# Patient Record
Sex: Female | Born: 1970 | Race: Black or African American | Hispanic: No | Marital: Single | Smoking: Never smoker
Health system: Southern US, Community
[De-identification: ages and names within clinical notes are randomized; demographics above are authoritative.]

## PROBLEM LIST (undated history)

## (undated) DIAGNOSIS — I10 Essential (primary) hypertension: Secondary | ICD-10-CM

## (undated) DIAGNOSIS — T7840XA Allergy, unspecified, initial encounter: Secondary | ICD-10-CM

---

## 2000-10-19 ENCOUNTER — Emergency Department (HOSPITAL_COMMUNITY): Admission: EM | Admit: 2000-10-19 | Discharge: 2000-10-19 | Payer: Self-pay | Admitting: Emergency Medicine

## 2001-01-04 ENCOUNTER — Emergency Department (HOSPITAL_COMMUNITY): Admission: EM | Admit: 2001-01-04 | Discharge: 2001-01-04 | Payer: Self-pay | Admitting: Emergency Medicine

## 2001-10-27 ENCOUNTER — Other Ambulatory Visit: Admission: RE | Admit: 2001-10-27 | Discharge: 2001-10-27 | Payer: Self-pay | Admitting: Obstetrics and Gynecology

## 2002-04-09 ENCOUNTER — Emergency Department (HOSPITAL_COMMUNITY): Admission: EM | Admit: 2002-04-09 | Discharge: 2002-04-09 | Payer: Self-pay | Admitting: Emergency Medicine

## 2004-03-08 ENCOUNTER — Emergency Department (HOSPITAL_COMMUNITY): Admission: EM | Admit: 2004-03-08 | Discharge: 2004-03-08 | Payer: Self-pay | Admitting: Emergency Medicine

## 2008-02-09 ENCOUNTER — Emergency Department (HOSPITAL_COMMUNITY): Admission: EM | Admit: 2008-02-09 | Discharge: 2008-02-10 | Payer: Self-pay | Admitting: Emergency Medicine

## 2008-06-18 ENCOUNTER — Emergency Department (HOSPITAL_COMMUNITY): Admission: EM | Admit: 2008-06-18 | Discharge: 2008-06-18 | Payer: Self-pay | Admitting: Emergency Medicine

## 2009-04-23 ENCOUNTER — Emergency Department (HOSPITAL_COMMUNITY): Admission: EM | Admit: 2009-04-23 | Discharge: 2009-04-23 | Payer: Self-pay | Admitting: Emergency Medicine

## 2010-03-01 ENCOUNTER — Emergency Department (HOSPITAL_COMMUNITY): Admission: EM | Admit: 2010-03-01 | Discharge: 2010-03-01 | Payer: Self-pay | Admitting: Emergency Medicine

## 2010-03-04 ENCOUNTER — Emergency Department (HOSPITAL_COMMUNITY): Admission: EM | Admit: 2010-03-04 | Discharge: 2010-03-05 | Payer: Self-pay | Admitting: Emergency Medicine

## 2010-03-31 ENCOUNTER — Emergency Department (HOSPITAL_COMMUNITY)
Admission: EM | Admit: 2010-03-31 | Discharge: 2010-04-01 | Payer: Self-pay | Source: Home / Self Care | Admitting: Emergency Medicine

## 2010-04-08 ENCOUNTER — Observation Stay (HOSPITAL_COMMUNITY)
Admission: EM | Admit: 2010-04-08 | Discharge: 2010-04-08 | Payer: Self-pay | Source: Home / Self Care | Admitting: Emergency Medicine

## 2010-04-08 ENCOUNTER — Encounter (INDEPENDENT_AMBULATORY_CARE_PROVIDER_SITE_OTHER): Payer: Self-pay | Admitting: Emergency Medicine

## 2010-05-12 ENCOUNTER — Emergency Department (HOSPITAL_COMMUNITY)
Admission: EM | Admit: 2010-05-12 | Discharge: 2010-05-12 | Payer: Self-pay | Source: Home / Self Care | Admitting: Emergency Medicine

## 2010-05-13 LAB — RAPID STREP SCREEN (MED CTR MEBANE ONLY): Streptococcus, Group A Screen (Direct): NEGATIVE

## 2010-05-28 ENCOUNTER — Encounter
Admission: RE | Admit: 2010-05-28 | Discharge: 2010-05-28 | Payer: Self-pay | Source: Home / Self Care | Attending: Allergy and Immunology | Admitting: Allergy and Immunology

## 2010-06-21 ENCOUNTER — Emergency Department (HOSPITAL_COMMUNITY): Payer: PRIVATE HEALTH INSURANCE

## 2010-06-21 ENCOUNTER — Emergency Department (HOSPITAL_COMMUNITY)
Admission: EM | Admit: 2010-06-21 | Discharge: 2010-06-22 | Disposition: A | Discharge status: Home / Self Care | Payer: PRIVATE HEALTH INSURANCE | Attending: Emergency Medicine | Admitting: Emergency Medicine

## 2010-06-21 DIAGNOSIS — R109 Unspecified abdominal pain: Secondary | ICD-10-CM | POA: Insufficient documentation

## 2010-06-21 DIAGNOSIS — Z79899 Other long term (current) drug therapy: Secondary | ICD-10-CM | POA: Insufficient documentation

## 2010-06-21 DIAGNOSIS — R52 Pain, unspecified: Secondary | ICD-10-CM

## 2010-06-21 DIAGNOSIS — I10 Essential (primary) hypertension: Secondary | ICD-10-CM | POA: Insufficient documentation

## 2010-06-21 LAB — DIFFERENTIAL
Basophils Absolute: 0 10*3/uL (ref 0.0–0.1)
Basophils Relative: 0 % (ref 0–1)
Eosinophils Absolute: 0.1 10*3/uL (ref 0.0–0.7)
Eosinophils Relative: 1 % (ref 0–5)
Lymphocytes Relative: 27 % (ref 12–46)
Lymphs Abs: 2.8 10*3/uL (ref 0.7–4.0)
Monocytes Absolute: 0.7 10*3/uL (ref 0.1–1.0)
Monocytes Relative: 7 % (ref 3–12)
Neutro Abs: 6.6 10*3/uL (ref 1.7–7.7)
Neutrophils Relative %: 65 % (ref 43–77)

## 2010-06-21 LAB — COMPREHENSIVE METABOLIC PANEL
ALT: 12 U/L (ref 0–35)
AST: 16 U/L (ref 0–37)
Albumin: 3.6 g/dL (ref 3.5–5.2)
Alkaline Phosphatase: 60 U/L (ref 39–117)
BUN: 9 mg/dL (ref 6–23)
CO2: 29 mEq/L (ref 19–32)
Calcium: 9.5 mg/dL (ref 8.4–10.5)
Chloride: 104 mEq/L (ref 96–112)
Creatinine, Ser: 0.94 mg/dL (ref 0.4–1.2)
GFR calc Af Amer: >60 mL/min (ref >60)
GFR calc non Af Amer: >60 mL/min (ref >60)
Glucose, Bld: 101 mg/dL — ABNORMAL HIGH (ref 70–99)
Potassium: 3.1 mEq/L — ABNORMAL LOW (ref 3.5–5.1)
Sodium: 139 mEq/L (ref 135–145)
Total Bilirubin: 0.5 mg/dL (ref 0.3–1.2)
Total Protein: 7.3 g/dL (ref 6.0–8.3)

## 2010-06-21 LAB — LIPASE, BLOOD: Lipase: 18 U/L (ref 11–59)

## 2010-06-21 LAB — CBC
HCT: 36.1 % (ref 36.0–46.0)
Hemoglobin: 11.3 g/dL — ABNORMAL LOW (ref 12.0–15.0)
MCH: 28 pg (ref 26.0–34.0)
MCHC: 31.3 g/dL (ref 30.0–36.0)
MCV: 89.4 fL (ref 78.0–100.0)
Platelets: 275 10*3/uL (ref 150–400)
RBC: 4.04 MIL/uL (ref 3.87–5.11)
RDW: 13 % (ref 11.5–15.5)
WBC: 10.1 10*3/uL (ref 4.0–10.5)

## 2010-06-22 LAB — URINALYSIS, ROUTINE W REFLEX MICROSCOPIC
Bilirubin Urine: NEGATIVE
Hgb urine dipstick: NEGATIVE
Ketones, ur: NEGATIVE mg/dL
Nitrite: NEGATIVE
Protein, ur: NEGATIVE mg/dL
Specific Gravity, Urine: 1.029 (ref 1.005–1.030)
Urine Glucose, Fasting: NEGATIVE mg/dL
Urobilinogen, UA: 1 mg/dL (ref 0.0–1.0)
pH: 6 (ref 5.0–8.0)

## 2010-06-22 LAB — POCT PREGNANCY, URINE: Preg Test, Ur: NEGATIVE

## 2010-06-25 ENCOUNTER — Inpatient Hospital Stay (INDEPENDENT_AMBULATORY_CARE_PROVIDER_SITE_OTHER)
Admission: RE | Admit: 2010-06-25 | Discharge: 2010-06-25 | Disposition: A | Discharge status: Home / Self Care | Payer: PRIVATE HEALTH INSURANCE | Source: Ambulatory Visit | Attending: Emergency Medicine | Admitting: Emergency Medicine

## 2010-06-25 DIAGNOSIS — E876 Hypokalemia: Secondary | ICD-10-CM

## 2010-06-25 LAB — POCT I-STAT, CHEM 8
BUN: 7 mg/dL (ref 6–23)
Calcium, Ion: 1.15 mmol/L (ref 1.12–1.32)
Chloride: 100 mEq/L (ref 96–112)
Creatinine, Ser: 1.1 mg/dL (ref 0.4–1.2)
Glucose, Bld: 93 mg/dL (ref 70–99)
HCT: 40 % (ref 36.0–46.0)
Hemoglobin: 13.6 g/dL (ref 12.0–15.0)
Potassium: 3.4 mEq/L — ABNORMAL LOW (ref 3.5–5.1)
Sodium: 140 mEq/L (ref 135–145)
TCO2: 29 mmol/L (ref 0–100)

## 2010-07-09 LAB — BASIC METABOLIC PANEL
BUN: 6 mg/dL (ref 6–23)
CO2: 28 mEq/L (ref 19–32)
Calcium: 9.5 mg/dL (ref 8.4–10.5)
Chloride: 101 mEq/L (ref 96–112)
Creatinine, Ser: 0.86 mg/dL (ref 0.4–1.2)
GFR calc Af Amer: >60 mL/min (ref >60)
GFR calc non Af Amer: >60 mL/min (ref >60)
Glucose, Bld: 115 mg/dL — ABNORMAL HIGH (ref 70–99)
Potassium: 2.8 mEq/L — ABNORMAL LOW (ref 3.5–5.1)
Sodium: 138 mEq/L (ref 135–145)

## 2010-07-09 LAB — DIFFERENTIAL
Basophils Absolute: 0 10*3/uL (ref 0.0–0.1)
Basophils Relative: 0 % (ref 0–1)
Eosinophils Absolute: 0.1 10*3/uL (ref 0.0–0.7)
Eosinophils Relative: 1 % (ref 0–5)
Lymphocytes Relative: 26 % (ref 12–46)
Lymphs Abs: 2.5 10*3/uL (ref 0.7–4.0)
Monocytes Absolute: 0.8 10*3/uL (ref 0.1–1.0)
Monocytes Relative: 8 % (ref 3–12)
Neutro Abs: 6.3 10*3/uL (ref 1.7–7.7)
Neutrophils Relative %: 65 % (ref 43–77)

## 2010-07-09 LAB — URINALYSIS, ROUTINE W REFLEX MICROSCOPIC
Glucose, UA: NEGATIVE mg/dL
Hgb urine dipstick: NEGATIVE
Ketones, ur: NEGATIVE mg/dL
Nitrite: NEGATIVE
Protein, ur: NEGATIVE mg/dL
Specific Gravity, Urine: 1.024 (ref 1.005–1.030)
Urobilinogen, UA: 1 mg/dL (ref 0.0–1.0)
pH: 6 (ref 5.0–8.0)

## 2010-07-09 LAB — POCT CARDIAC MARKERS
CKMB, poc: <1 ng/mL — ABNORMAL LOW (ref 1.0–8.0)
CKMB, poc: <1 ng/mL — ABNORMAL LOW (ref 1.0–8.0)
Myoglobin, poc: 48.3 ng/mL (ref 12–200)
Myoglobin, poc: 62.9 ng/mL (ref 12–200)
Troponin i, poc: 0.11 ng/mL — ABNORMAL HIGH (ref 0.00–0.09)
Troponin i, poc: <0.05 ng/mL (ref 0.00–0.09)

## 2010-07-09 LAB — CBC
HCT: 33.8 % — ABNORMAL LOW (ref 36.0–46.0)
Hemoglobin: 10.9 g/dL — ABNORMAL LOW (ref 12.0–15.0)
MCH: 28.9 pg (ref 26.0–34.0)
MCHC: 32.2 g/dL (ref 30.0–36.0)
MCV: 89.7 fL (ref 78.0–100.0)
Platelets: 289 10*3/uL (ref 150–400)
RBC: 3.77 MIL/uL — ABNORMAL LOW (ref 3.87–5.11)
RDW: 14.2 % (ref 11.5–15.5)
WBC: 9.7 10*3/uL (ref 4.0–10.5)

## 2010-07-09 LAB — TROPONIN I
Troponin I: 0.02 ng/mL (ref 0.00–0.06)
Troponin I: 0.02 ng/mL (ref 0.00–0.06)

## 2010-07-09 LAB — CK TOTAL AND CKMB (NOT AT ARMC)
CK, MB: 0.5 ng/mL (ref 0.3–4.0)
CK, MB: 0.6 ng/mL (ref 0.3–4.0)
Relative Index: INVALID (ref 0.0–2.5)
Relative Index: INVALID (ref 0.0–2.5)
Total CK: 77 U/L (ref 7–177)
Total CK: 80 U/L (ref 7–177)

## 2011-07-08 ENCOUNTER — Encounter (HOSPITAL_COMMUNITY): Payer: Self-pay | Admitting: *Deleted

## 2011-07-08 ENCOUNTER — Emergency Department (INDEPENDENT_AMBULATORY_CARE_PROVIDER_SITE_OTHER)
Admission: EM | Admit: 2011-07-08 | Discharge: 2011-07-08 | Disposition: A | Discharge status: Home Health | Payer: Self-pay | Source: Home / Self Care | Attending: Family Medicine | Admitting: Family Medicine

## 2011-07-08 DIAGNOSIS — I10 Essential (primary) hypertension: Secondary | ICD-10-CM

## 2011-07-08 HISTORY — DX: Essential (primary) hypertension: I10

## 2011-07-08 HISTORY — DX: Allergy, unspecified, initial encounter: T78.40XA

## 2011-07-08 LAB — POCT I-STAT, CHEM 8
BUN: 7 mg/dL (ref 6–23)
Chloride: 102 mEq/L (ref 96–112)
Creatinine, Ser: 1 mg/dL (ref 0.50–1.10)
Sodium: 140 mEq/L (ref 135–145)
TCO2: 27 mmol/L (ref 0–100)

## 2011-07-08 MED ORDER — HYDROCHLOROTHIAZIDE 25 MG PO TABS: 25.0000 mg | ORAL_TABLET | Freq: Every day | ORAL | Status: AC

## 2011-07-08 NOTE — ED Notes (Signed)
Pt reports she was Dx'd with HTN 2011 and her RX for HCTZ 25 mg ran out 2 days ago.   The Dr at the [facility] would not see her without payment

## 2011-07-08 NOTE — ED Provider Notes (Signed)
History     CSN: [id number]  Arrival date & time 07/08/11  1044   First MD Initiated Contact with Patient 07/08/11 1057      Chief Complaint  Patient presents with  . Hypertension    (Consider location/radiation/quality/duration/timing/severity/associated sxs/prior treatment) Patient is a 41 y.o. female presenting with hypertension. The history is provided by the patient.  Hypertension This is a new problem. The current episode started 2 days ago (ran out of bp med 2 d ago.). The problem has not changed since onset.Pertinent negatives include no chest pain, no headaches and no shortness of breath. Associated symptoms comments: Has been on hctz for approx 2 yrs..    Past Medical History  Diagnosis Date  . Hypertension   . Allergic     History reviewed. No pertinent past surgical history.  Family History  Problem Relation Age of Onset  . Diabetes Mother   . Hyperlipidemia Mother     History  Substance Use Topics  . Smoking status: Never Smoker   . Smokeless tobacco: Not on file  . Alcohol Use: Yes    OB History    Grav Para Term Preterm Abortions TAB SAB Ect Mult Living                  Review of Systems  Constitutional: Negative.   Respiratory: Negative for chest tightness and shortness of breath.   Cardiovascular: Negative for chest pain.  Neurological: Negative for headaches.    Allergies  Review of patient's allergies indicates no known allergies.  Home Medications   Current Outpatient Rx  Name Route Sig Dispense Refill  . FLUTICASONE FUROATE 27.5 MCG/SPRAY NA SUSP Nasal Place 2 sprays into the nose daily.    Marland Kitchen HYDROCHLOROTHIAZIDE 25 MG PO TABS Oral Take 25 mg by mouth daily.      BP 112/55  Pulse 86  Temp(Src) 97.9 F (36.6 C) (Oral)  Resp 16  SpO2 99%  LMP 06/13/2011  Physical Exam  Nursing note and vitals reviewed. Constitutional: She is oriented to person, place, and time. She appears well-developed and well-nourished.  HENT:  Head:  Normocephalic.  Eyes: Pupils are equal, round, and reactive to light.  Neck: Normal range of motion. Neck supple.  Cardiovascular: Normal rate, regular rhythm, normal heart sounds and intact distal pulses.   Pulmonary/Chest: Effort normal and breath sounds normal.  Lymphadenopathy:    She has no cervical adenopathy.  Neurological: She is alert and oriented to person, place, and time.    ED Course  Procedures (including critical care time)  Labs Reviewed - No data to display No results found.   No diagnosis found.    MDM          Linna Hoff, MD 07/08/11 [phone]

## 2012-10-20 IMAGING — CR DG NECK SOFT TISSUE
1 series · 1 of 1 positions shown · non-contrast
Comparison: None.

CLINICAL DATA: Sore throat

NECK SOFT TISSUES - 1+ VIEW

[w soft tissue neck *]
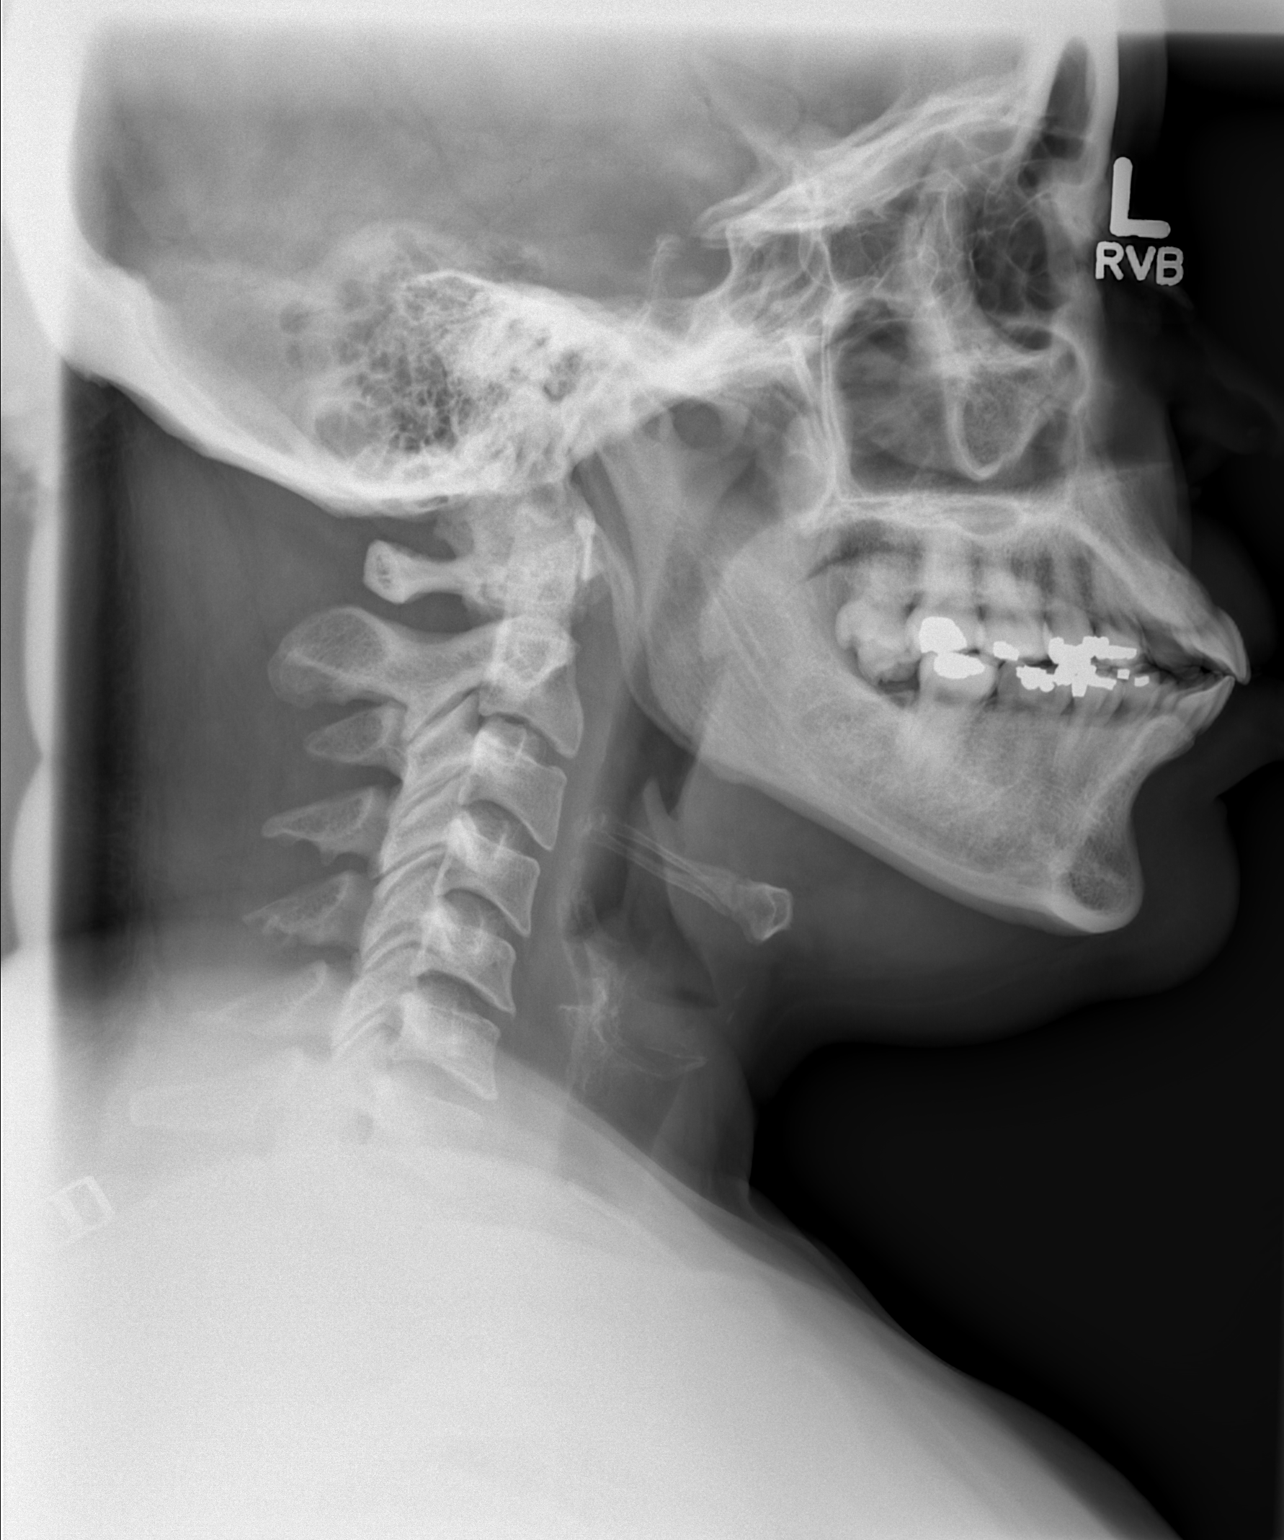

[1 of 1 positions shown; findings below may reference images not displayed]

FINDINGS: The epiglottis and aryepiglottic folds do not appear
enlarged.  No dilation of the hypopharynx.  No prominence of the
retropharyngeal space.
IMPRESSION: Negative single lateral view of the neck.

## 2013-01-01 ENCOUNTER — Emergency Department (HOSPITAL_COMMUNITY)
Admission: EM | Admit: 2013-01-01 | Discharge: 2013-01-01 | Disposition: A | Discharge status: Home / Self Care | Payer: PRIVATE HEALTH INSURANCE | Attending: Emergency Medicine | Admitting: Emergency Medicine

## 2013-01-01 ENCOUNTER — Encounter (HOSPITAL_COMMUNITY): Payer: Self-pay | Admitting: Emergency Medicine

## 2013-01-01 ENCOUNTER — Emergency Department (HOSPITAL_COMMUNITY): Payer: PRIVATE HEALTH INSURANCE

## 2013-01-01 DIAGNOSIS — I10 Essential (primary) hypertension: Secondary | ICD-10-CM | POA: Insufficient documentation

## 2013-01-01 DIAGNOSIS — M25569 Pain in unspecified knee: Secondary | ICD-10-CM | POA: Insufficient documentation

## 2013-01-01 DIAGNOSIS — M25561 Pain in right knee: Secondary | ICD-10-CM

## 2013-01-01 DIAGNOSIS — Z79899 Other long term (current) drug therapy: Secondary | ICD-10-CM | POA: Insufficient documentation

## 2013-01-01 MED ORDER — NAPROXEN 500 MG PO TABS: 500.0000 mg | ORAL_TABLET | Freq: Two times a day (BID) | ORAL | Status: DC

## 2013-01-01 NOTE — ED Provider Notes (Signed)
Medical screening examination/treatment/procedure(s) were performed by non-physician practitioner and as supervising physician I was immediately available for consultation/collaboration.     Celene Kras, MD 01/01/13 [phone]

## 2013-01-01 NOTE — ED Provider Notes (Signed)
CSN: [id number]     Arrival date & time 01/01/13  4098 History   First MD Initiated Contact with Patient 01/01/13 1924     Chief Complaint  Patient presents with  . Knee Pain   (Consider location/radiation/quality/duration/timing/severity/associated sxs/prior Treatment) HPI Patient is a 42 year old female who presents to emergency department complaining of right knee pain. Patient states that the pain began 2 weeks ago. Patient denies any injuries to the leg. Patient states that it is painful for her to bear weight and bend the knee. Patient does report some clicking and popping sensation. Patient has not tried any medications or icing at home. Patient reports that she has had prior right knee problems in the past, states she has seen her doctor a few years ago for the same pain and was told she had fluid on her knee. Patient has never seen an orthopedic doctor for this problem. Patient denies any known injuries. Patient denies any numbness or weakness in the leg. Patient denies any fever chills or Past Medical History  Diagnosis Date  . Hypertension   . Allergic    No past surgical history on file. Family History  Problem Relation Age of Onset  . Diabetes Mother   . Hyperlipidemia Mother    History  Substance Use Topics  . Smoking status: Never Smoker   . Smokeless tobacco: Not on file  . Alcohol Use: Yes   OB History   Grav Para Term Preterm Abortions TAB SAB Ect Mult Living                 Review of Systems  Constitutional: Negative for fever and chills.  HENT: Negative for neck pain and neck stiffness.   Respiratory: Negative for cough, chest tightness and shortness of breath.   Cardiovascular: Negative for chest pain, palpitations and leg swelling.  Musculoskeletal: Positive for joint swelling and arthralgias.  Skin: Negative for color change and rash.  Neurological: Negative for dizziness, weakness, numbness and headaches.  All other systems reviewed and are  negative.    Allergies  Review of patient's allergies indicates no known allergies.  Home Medications   Current Outpatient Rx  Name  Route  Sig  Dispense  Refill  . fexofenadine (ALLEGRA) 180 MG tablet   Oral   Take 180 mg by mouth daily.         Marland Kitchen triamcinolone (NASACORT) 55 MCG/ACT nasal inhaler   Nasal   Place 2 sprays into the nose daily.          BP 138/79  Pulse 81  Temp(Src) 98.1 F (36.7 C) (Oral)  Resp 16  SpO2 94%  LMP 01/01/2013 Physical Exam  Nursing note and vitals reviewed. Constitutional: She appears well-developed and well-nourished. No distress.  Eyes: Conjunctivae are normal.  Neck: Neck supple.  Musculoskeletal:  Normal appearing right knee. Tender to palpation over medial joint. Full ROM of the knee joint. Pain with full flexion and extension. Negative anterior and posterior drawer signs. No laxity or pain with medial or lateral stress.    Neurological: She is alert.  Skin: Skin is warm and dry.    ED Course  Procedures (including critical care time) Labs Review Labs Reviewed - No data to display Imaging Review Dg Knee Complete 4 Views Right  01/01/2013   *RADIOLOGY REPORT*  Clinical Data: Generalized right knee pain  RIGHT KNEE - COMPLETE 4+ VIEW  Comparison: None.  Findings: No fracture dislocation of the right knee.  There is a moderate sized  suprapatellar joint effusion.  Mild spurring of the superior aspect patella.  IMPRESSION: Moderate joint effusion.  No acute osseous abnormality.   Original Report Authenticated By: Genevive Bi, M.D.    MDM   1. Knee pain, right      Patient with acute on chronic right knee pain. Exam is unremarkable. X-ray shows moderate joint effusion. Patient is afebrile. Joint is not red, not warm to the touch, doubt infectious. Did not have any recent injuries. I suspect possible arthritis versus meniscal injury. Given Ace wrap for swelling and support. Home with Naprosyn and followup with  orthopedics.  Filed Vitals:   01/01/13 1859  BP: 138/79  Pulse: 81  Temp: 98.1 F (36.7 C)  TempSrc: Oral  Resp: 16  SpO2: 94%        Marques Ericson A Harvest Deist, PA-C 01/01/13 2016

## 2013-01-01 NOTE — ED Notes (Signed)
Pt c/o rt knee pain x 2 wks.  Denies falling.  States she noticed it while at work.

## 2013-03-26 ENCOUNTER — Emergency Department (HOSPITAL_COMMUNITY): Payer: Self-pay

## 2013-03-26 ENCOUNTER — Encounter (HOSPITAL_COMMUNITY): Payer: Self-pay | Admitting: Emergency Medicine

## 2013-03-26 ENCOUNTER — Emergency Department (HOSPITAL_COMMUNITY)
Admission: EM | Admit: 2013-03-26 | Discharge: 2013-03-26 | Disposition: A | Discharge status: Home / Self Care | Payer: Self-pay | Attending: Emergency Medicine | Admitting: Emergency Medicine

## 2013-03-26 DIAGNOSIS — IMO0002 Reserved for concepts with insufficient information to code with codable children: Secondary | ICD-10-CM | POA: Insufficient documentation

## 2013-03-26 DIAGNOSIS — J209 Acute bronchitis, unspecified: Secondary | ICD-10-CM | POA: Insufficient documentation

## 2013-03-26 DIAGNOSIS — I10 Essential (primary) hypertension: Secondary | ICD-10-CM | POA: Insufficient documentation

## 2013-03-26 DIAGNOSIS — Z79899 Other long term (current) drug therapy: Secondary | ICD-10-CM | POA: Insufficient documentation

## 2013-03-26 DIAGNOSIS — J40 Bronchitis, not specified as acute or chronic: Secondary | ICD-10-CM

## 2013-03-26 MED ORDER — ALBUTEROL SULFATE HFA 108 (90 BASE) MCG/ACT IN AERS
1.0000 | INHALATION_SPRAY | Freq: Four times a day (QID) | RESPIRATORY_TRACT | Status: DC | PRN
  Filled 2013-03-26: qty 6.7

## 2013-03-26 MED ORDER — AZITHROMYCIN 250 MG PO TABS: 250.0000 mg | ORAL_TABLET | Freq: Every day | ORAL | Status: DC

## 2013-03-26 NOTE — ED Notes (Signed)
Pt c/o productive cough w/ yellow mucous x 1 wk.  Denies NVD.

## 2013-03-26 NOTE — Discharge Instructions (Signed)
Bronchitis °Bronchitis is a problem of the air tubes leading to your lungs. This problem makes it hard for air to get in and out of the lungs. You may cough a lot because your air tubes are narrow. Going without care can cause lasting (chronic) bronchitis. °HOME CARE  °· Drink enough fluids to keep your pee (urine) clear or pale yellow. °· Use a cool mist humidifier. °· Quit smoking if you smoke. If you keep smoking, the bronchitis might not get better. °· Only take medicine as told by your doctor. °GET HELP RIGHT AWAY IF:  °· Coughing keeps you awake. °· You start to wheeze. °· You become more sick or weak. °· You have a hard time breathing or get short of breath. °· You cough up blood. °· Coughing lasts more than 2 weeks. °· You have a fever. °· Your baby is older than 3 months with a rectal temperature of 102° F (38.9° C) or higher. °· Your baby is 3 months old or younger with a rectal temperature of 100.4° F (38° C) or higher. °MAKE SURE YOU: °· Understand these instructions. °· Will watch your condition. °· Will get help right away if you are not doing well or get worse. °Document Released: 10/01/2007 Document Revised: 07/07/2011 Document Reviewed: 12/07/2012 °ExitCare® Patient Information ©2014 [employer]. ° °

## 2013-03-26 NOTE — ED Provider Notes (Addendum)
CSN: [id number]     Arrival date & time 03/26/13  1339 History   First MD Initiated Contact with Patient 03/26/13 1346     Chief Complaint  Patient presents with  . Cough   (Consider location/radiation/quality/duration/timing/severity/associated sxs/prior Treatment) Patient is a 42 y.o. female presenting with cough. The history is provided by the patient.  Cough Cough characteristics:  Productive Sputum characteristics:  Yellow Severity:  Severe Onset quality:  Gradual Duration:  10 days Timing:  Constant Progression:  Worsening Chronicity:  New Smoker: no   Context: upper respiratory infection   Relieved by:  Nothing Worsened by:  Lying down and activity Ineffective treatments:  Leukotriene antagonist and decongestant Associated symptoms: rhinorrhea and wheezing   Associated symptoms: no chest pain, no fever, no myalgias and no shortness of breath   Risk factors comment:  Works in [employer] school   Past Medical History  Diagnosis Date  . Hypertension   . Allergic    No past surgical history on file. Family History  Problem Relation Age of Onset  . Diabetes Mother   . Hyperlipidemia Mother    History  Substance Use Topics  . Smoking status: Never Smoker   . Smokeless tobacco: Not on file  . Alcohol Use: Yes   OB History   Grav Para Term Preterm Abortions TAB SAB Ect Mult Living                 Review of Systems  Constitutional: Negative for fever.  HENT: Positive for rhinorrhea.   Respiratory: Positive for cough and wheezing. Negative for shortness of breath.   Cardiovascular: Negative for chest pain.  Musculoskeletal: Negative for myalgias.  All other systems reviewed and are negative.    Allergies  Review of patient's allergies indicates no known allergies.  Home Medications   Current Outpatient Rx  Name  Route  Sig  Dispense  Refill  . fexofenadine (ALLEGRA) 180 MG tablet   Oral   Take 180 mg by mouth daily.         Marland Kitchen triamcinolone  (NASACORT) 55 MCG/ACT nasal inhaler   Nasal   Place 2 sprays into the nose daily.          LMP 03/12/2013 Physical Exam  Nursing note and vitals reviewed. Constitutional: She is oriented to person, place, and time. She appears well-developed and well-nourished. No distress.  HENT:  Head: Normocephalic and atraumatic.  Right Ear: Tympanic membrane and ear canal normal.  Left Ear: Tympanic membrane and ear canal normal.  Mouth/Throat: No oropharyngeal exudate or posterior oropharyngeal edema.  Eyes: EOM are normal. Pupils are equal, round, and reactive to light.  Cardiovascular: Normal rate, regular rhythm, normal heart sounds and intact distal pulses.  Exam reveals no friction rub.   No murmur heard. Pulmonary/Chest: Effort normal and breath sounds normal. She has no wheezes. She has no rales.  Abdominal: Soft. Bowel sounds are normal. She exhibits no distension. There is no tenderness. There is no rebound and no guarding.  Musculoskeletal: Normal range of motion. She exhibits no tenderness.  No edema  Neurological: She is alert and oriented to person, place, and time. No cranial nerve deficit.  Skin: Skin is warm and dry. No rash noted.  Psychiatric: She has a normal mood and affect. Her behavior is normal.    ED Course  Procedures (including critical care time) Labs Review Labs Reviewed - No data to display Imaging Review Dg Chest 2 View  03/26/2013   CLINICAL DATA:  Productive cough  EXAM: CHEST  2 VIEW  COMPARISON:  04/08/2010  FINDINGS: Lungs are clear. No pleural effusion or pneumothorax.  The heart is normal in size.  Degenerative changes of the visualized thoracolumbar spine.  IMPRESSION: No evidence of acute cardiopulmonary disease.   Electronically Signed   By: Charline Bills M.D.   On: 03/26/2013 14:30    EKG Interpretation   None       MDM   1. Bronchitis     Pt with symptoms consistent with bronchitis which has now been ongoing for 10 days.  Well  appearing here.  No signs of breathing difficulty  No signs of pharyngitis, otitis or abnormal abdominal findings.   CXR wnl and pt to return with any further problems. Pt treated with inhaler and zithromycin     Gwyneth Sprout, MD 03/26/13 1458  Gwyneth Sprout, MD 03/26/13 1459

## 2013-03-31 ENCOUNTER — Emergency Department (HOSPITAL_COMMUNITY)
Admission: EM | Admit: 2013-03-31 | Discharge: 2013-03-31 | Disposition: A | Discharge status: Home / Self Care | Payer: Self-pay | Attending: Emergency Medicine | Admitting: Emergency Medicine

## 2013-03-31 ENCOUNTER — Emergency Department (HOSPITAL_COMMUNITY): Payer: Self-pay

## 2013-03-31 ENCOUNTER — Encounter (HOSPITAL_COMMUNITY): Payer: Self-pay | Admitting: Emergency Medicine

## 2013-03-31 DIAGNOSIS — I10 Essential (primary) hypertension: Secondary | ICD-10-CM | POA: Insufficient documentation

## 2013-03-31 DIAGNOSIS — R05 Cough: Secondary | ICD-10-CM

## 2013-03-31 DIAGNOSIS — Z79899 Other long term (current) drug therapy: Secondary | ICD-10-CM | POA: Insufficient documentation

## 2013-03-31 DIAGNOSIS — R059 Cough, unspecified: Secondary | ICD-10-CM | POA: Insufficient documentation

## 2013-03-31 MED ORDER — LEVOFLOXACIN 750 MG PO TABS: 750.0000 mg | ORAL_TABLET | Freq: Every day | ORAL | Status: DC

## 2013-03-31 MED ORDER — HYDROCOD POLST-CHLORPHEN POLST 10-8 MG/5ML PO LQCR: 5.0000 mL | Freq: Two times a day (BID) | ORAL | Status: DC | PRN

## 2013-03-31 NOTE — ED Notes (Signed)
Pt alert, nad, arrives from home, c/o cough, onset several days ago, resp even unlabored, skin pwd

## 2013-03-31 NOTE — Progress Notes (Signed)
[facility] provided pt with a list of primary care resources and a [facility] application. Also, provided pt with information on [employer].

## 2013-03-31 NOTE — ED Provider Notes (Signed)
Medical screening examination/treatment/procedure(s) were performed by non-physician practitioner and as supervising physician I was immediately available for consultation/collaboration.  EKG Interpretation   None         Shanna Cisco, MD 03/31/13 [phone]

## 2013-03-31 NOTE — ED Provider Notes (Signed)
CSN: [id number]     Arrival date & time 03/31/13  1027 History   First MD Initiated Contact with Patient 03/31/13 1044     Chief Complaint  Patient presents with  . Cough   (Consider location/radiation/quality/duration/timing/severity/associated sxs/prior Treatment) Patient is a 42 y.o. female presenting with cough. The history is provided by the patient and medical records.  Cough  This is a 42 y.o. F with PMH significant for HTN, seasonal allergies, presenting to the ED for continued productive cough x 2 weeks.  Pt seen in the ED on 03/26/13 for the same-- given inhaler and z-pack.  States she took the last dose of her abx yesterday.  Upon waking this morning, states she feels worse.  Pt has been using inhaler as directed but has not noticed a great deal of improvement.  Denies any chest pain, SOB, or palpitations.  No sick contacts.  No fevers, sweats, or chills. VS stable on arrival.  Past Medical History  Diagnosis Date  . Hypertension   . Allergic    History reviewed. No pertinent past surgical history. Family History  Problem Relation Age of Onset  . Diabetes Mother   . Hyperlipidemia Mother    History  Substance Use Topics  . Smoking status: Never Smoker   . Smokeless tobacco: Not on file  . Alcohol Use: Yes   OB History   Grav Para Term Preterm Abortions TAB SAB Ect Mult Living                 Review of Systems  Respiratory: Positive for cough.   All other systems reviewed and are negative.    Allergies  Review of patient's allergies indicates no known allergies.  Home Medications   Current Outpatient Rx  Name  Route  Sig  Dispense  Refill  . albuterol (PROVENTIL HFA;VENTOLIN HFA) 108 (90 BASE) MCG/ACT inhaler   Inhalation   Inhale 2 puffs into the lungs every 6 (six) hours as needed for wheezing or shortness of breath.         Marland Kitchen azithromycin (ZITHROMAX) 250 MG tablet   Oral   Take 1 tablet (250 mg total) by mouth daily. Take first 2 tablets together,  then 1 every day until finished.   6 tablet   0   . fexofenadine (ALLEGRA) 180 MG tablet   Oral   Take 180 mg by mouth daily.         Marland Kitchen triamcinolone (NASACORT) 55 MCG/ACT nasal inhaler   Nasal   Place 2 sprays into the nose daily.          BP 152/83  Pulse 87  Temp(Src) 98 F (36.7 C) (Oral)  Resp 16  SpO2 94%  LMP 03/12/2013  Physical Exam  Nursing note and vitals reviewed. Constitutional: She is oriented to person, place, and time. She appears well-developed and well-nourished. No distress.  HENT:  Head: Normocephalic and atraumatic.  Right Ear: Tympanic membrane and ear canal normal.  Left Ear: Tympanic membrane and ear canal normal.  Nose: Nose normal.  Mouth/Throat: Uvula is midline, oropharynx is clear and moist and mucous membranes are normal.  Some PND noted in oropharynx; tonsils are normal in appearance bilaterally without exudate, uvula midline, no peritonsillar abscess  Eyes: Conjunctivae and EOM are normal. Pupils are equal, round, and reactive to light.  Neck: Normal range of motion. Neck supple.  Cardiovascular: Normal rate, regular rhythm and normal heart sounds.   Pulmonary/Chest: Effort normal and breath sounds normal. No respiratory  distress. She has no wheezes.  Lungs CTAB  Musculoskeletal: Normal range of motion.  Neurological: She is alert and oriented to person, place, and time.  Skin: Skin is warm and dry. She is not diaphoretic.  Psychiatric: She has a normal mood and affect.    ED Course  Procedures (including critical care time) Labs Review Labs Reviewed - No data to display Imaging Review Dg Chest 2 View  03/31/2013   CLINICAL DATA:  Shortness of breath, mild coughing  EXAM: CHEST  2 VIEW  COMPARISON:  03/26/2013  FINDINGS: The heart size and vascular pattern are normal. No effusion. Left lung is clear. There is minimal opacity in the medial right lung base. This is not seen on the lateral view.  IMPRESSION: Minimal opacity medial right  lung base. This could represent mild atelectasis. In the appropriate clinical setting, the possibility of subtle developing pneumonia would be a consideration as well.   Electronically Signed   By: Esperanza Heir M.D.   On: 03/31/2013 11:08    EKG Interpretation   None       MDM   1. Cough    Pt overall well appearing, lungs CTAB.  O2 sats stable on RA.  CXR as above-- questionable early developing pneumonia.  Given pts continued sx, will start on course of Levaquin and tussionex.  FU with [facility] if sx persist/no improvement after this course of abx.  Discussed plan with pt, she agreed.  Return precautions advised.  Garlon Hatchet, PA-C 03/31/13 1319

## 2015-06-12 IMAGING — CR DG KNEE COMPLETE 4+V*R*
5 series · 5 of 5 positions shown · non-contrast
Comparison: None.

CLINICAL DATA: Generalized right knee pain

RIGHT KNEE - COMPLETE 4+ VIEW

[t knee ap right]
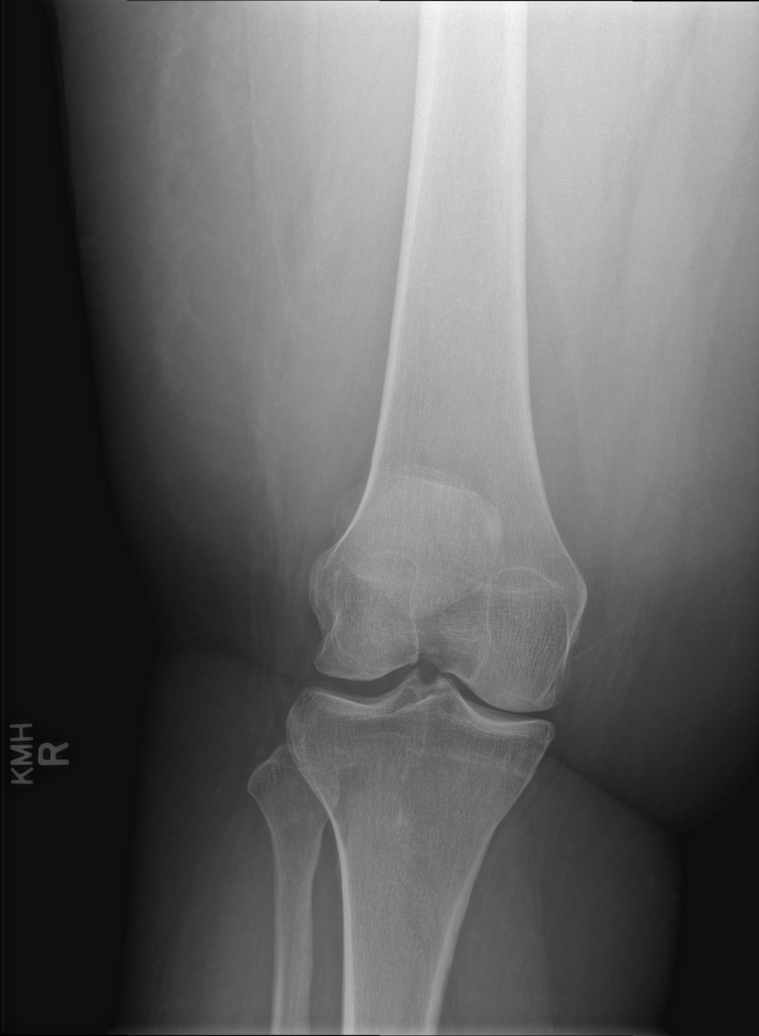

[t knee obl right (1 of 2)]
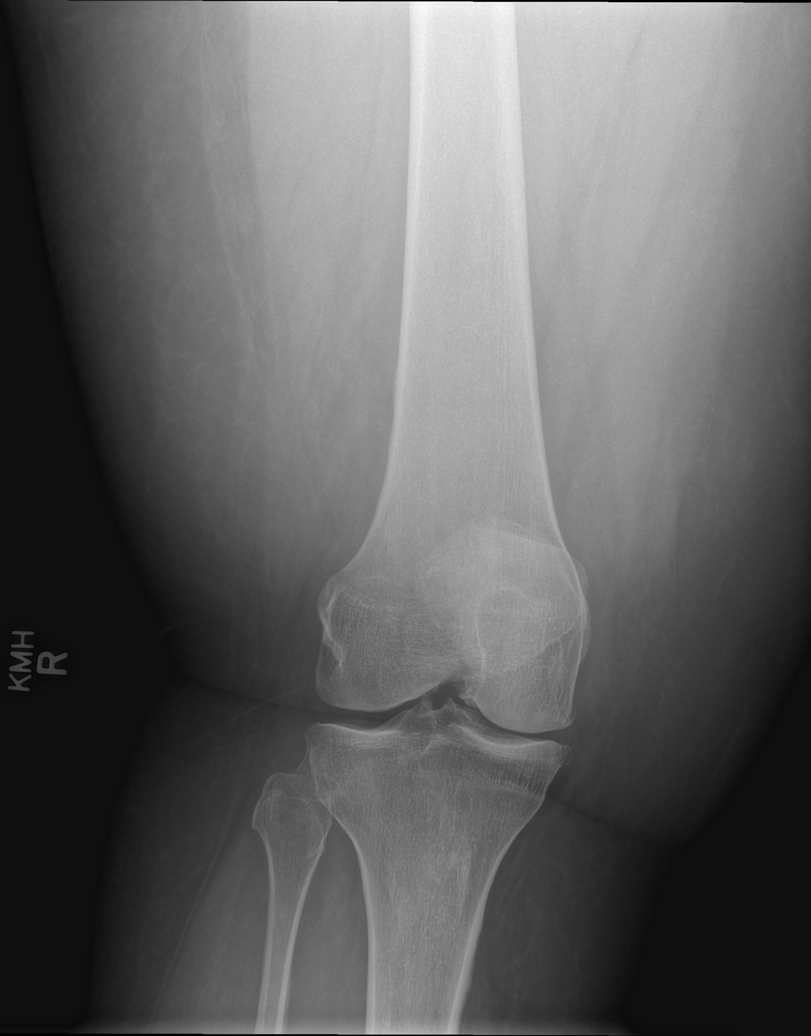

[t knee obl right (2 of 2)]
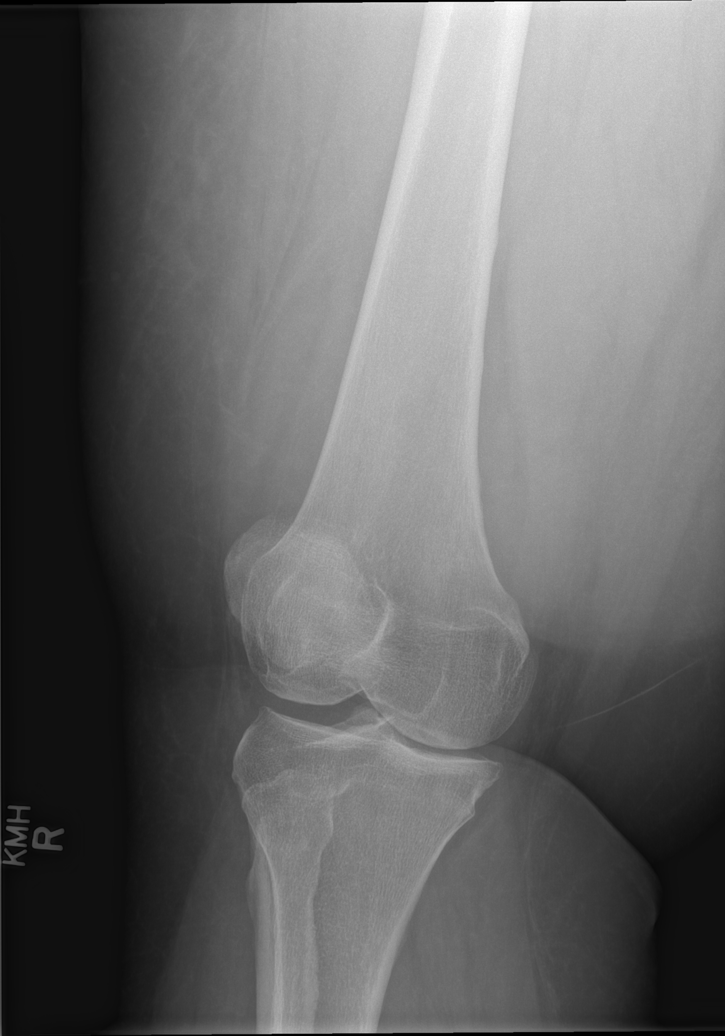

[t knee lat right (1 of 2)]
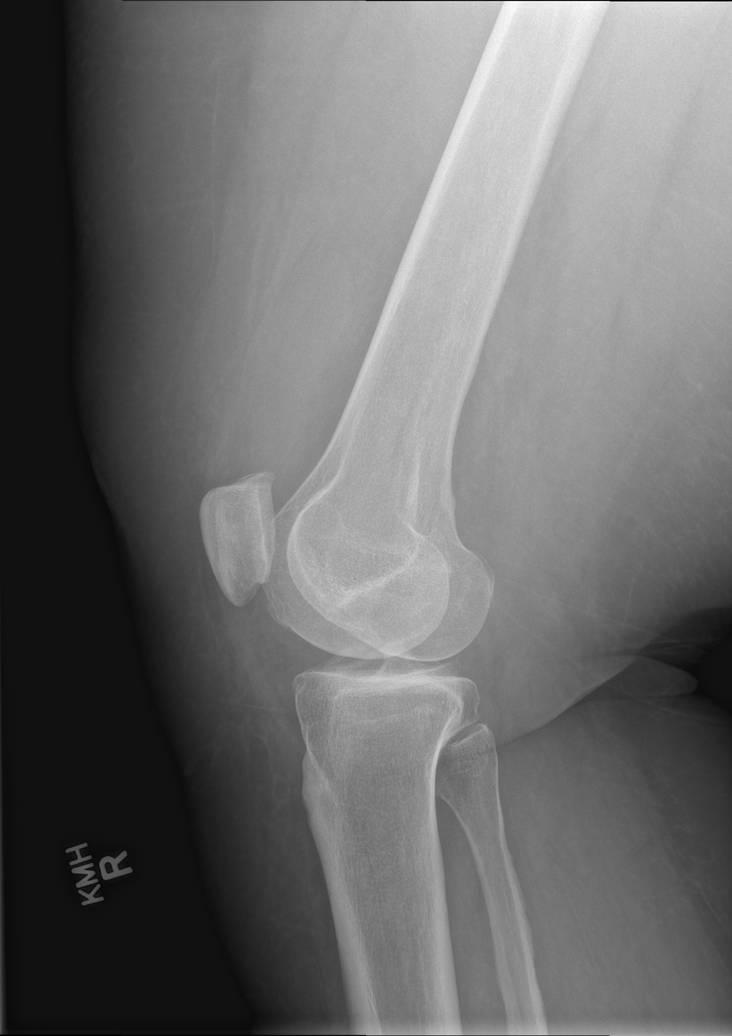

[t knee lat right (2 of 2)]
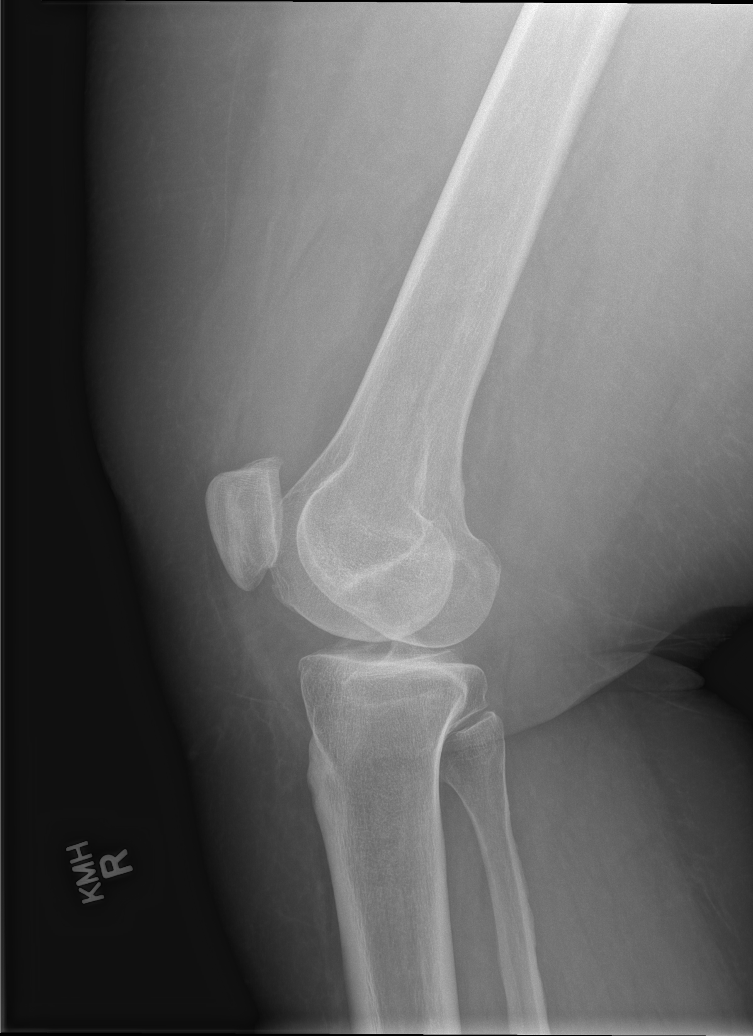

[5 of 5 positions shown; findings below may reference images not displayed]

FINDINGS: No fracture dislocation of the right knee.  There is a
moderate sized suprapatellar joint effusion.  Mild spurring of the
superior aspect patella.
IMPRESSION: Moderate joint effusion.  No acute osseous abnormality.

## 2015-07-11 ENCOUNTER — Telehealth: Payer: Self-pay

## 2015-07-11 NOTE — Telephone Encounter (Signed)
First and last dictation faxed to [facility].

## 2015-07-11 NOTE — Telephone Encounter (Signed)
Please have patient use her Qvar 3 inhalations three times per day while sick, and increase pantiprazole to twice a day while sick and use her ventolin if needed. And contact use if she is not doing well.

## 2015-07-11 NOTE — Telephone Encounter (Signed)
PLEASE ADVISE.

## 2015-07-11 NOTE — Telephone Encounter (Signed)
Patient has been having congestion with a yellowish when coughing. When she is blowing it is a clear color. This has been going on for a few days. Patient is wondering what could she take over the counter or could something be called in. She has been talking her normal meds as prescribed.   [facility] on [address]

## 2015-07-11 NOTE — Telephone Encounter (Signed)
Spoke with patient advised as written below per Dr Neldon Mc. Patient verbalized understanding

## 2015-07-11 NOTE — Telephone Encounter (Signed)
Please get me chart as she is not in epic

## 2015-07-16 ENCOUNTER — Other Ambulatory Visit: Payer: Self-pay | Admitting: Allergy and Immunology

## 2015-08-30 ENCOUNTER — Other Ambulatory Visit: Payer: Self-pay | Admitting: Allergy and Immunology

## 2015-09-03 ENCOUNTER — Other Ambulatory Visit: Payer: Self-pay | Admitting: Allergy and Immunology

## 2015-09-03 MED ORDER — BECLOMETHASONE DIPROPIONATE 80 MCG/ACT IN AERS: INHALATION_SPRAY | RESPIRATORY_TRACT | Status: DC

## 2015-09-03 NOTE — Telephone Encounter (Signed)
Pt called to get you to send in a qvar because she took her last two puffs today. She said that she could not come in right now. [facility] [address]  [phone]

## 2015-09-03 NOTE — Telephone Encounter (Signed)
Sent in rx based on last office visit 12/01/2014 return in 1 year

## 2015-12-18 ENCOUNTER — Encounter: Payer: Self-pay | Admitting: Allergy and Immunology

## 2015-12-18 ENCOUNTER — Encounter (INDEPENDENT_AMBULATORY_CARE_PROVIDER_SITE_OTHER): Payer: Self-pay

## 2015-12-18 ENCOUNTER — Ambulatory Visit (INDEPENDENT_AMBULATORY_CARE_PROVIDER_SITE_OTHER): Payer: BLUE CROSS/BLUE SHIELD | Admitting: Allergy and Immunology

## 2015-12-18 VITALS — BP 134/82 | HR 76 | Resp 20 | Ht 67.0 in | Wt 313.0 lb

## 2015-12-18 DIAGNOSIS — J4530 Mild persistent asthma, uncomplicated: Secondary | ICD-10-CM | POA: Diagnosis not present

## 2015-12-18 DIAGNOSIS — K219 Gastro-esophageal reflux disease without esophagitis: Secondary | ICD-10-CM | POA: Diagnosis not present

## 2015-12-18 DIAGNOSIS — H1010 Acute atopic conjunctivitis, unspecified eye: Secondary | ICD-10-CM

## 2015-12-18 DIAGNOSIS — J309 Allergic rhinitis, unspecified: Secondary | ICD-10-CM

## 2015-12-18 NOTE — Progress Notes (Signed)
Follow-up Note  Referring Provider: No ref. provider found Primary Provider: Clance Boll, NP Date of Office Visit: 12/18/2015  Subjective:   Stacey Shelton (DOB: 1970/07/03) is a 45 y.o. female who returns to the [facility] on 12/18/2015 in re-evaluation of the following:  HPI: Stacey Shelton presents this clinic in reevaluation of her asthma and allergic rhinoconjunctivitis and history of GERD. I've not seen her in his clinic in 1 year.  During the interval she has done very well. She's had very little problems with asthma. She has not required a systemic steroid and rarely uses any short acting bronchodilator while continuing on Qvar 80 2 inhalations daily.  Her nose has been doing very well while using over-the-counter Nasacort. She does not require any antibiotic for an episode of sinusitis.  She no longer requires any therapy for reflux as this has basically melted away.    Medication List      albuterol 108 (90 Base) MCG/ACT inhaler Commonly known as:  PROVENTIL HFA;VENTOLIN HFA Inhale 2 puffs into the lungs every 6 (six) hours as needed for wheezing or shortness of breath.   beclomethasone 80 MCG/ACT inhaler Commonly known as:  QVAR INHALE 2 PUFFS BY MOUTH ONCE DAILY TO PREVENT COUGH OR WHEEZE, RINSE, GARGLE AND SPIT AFTER USE   fexofenadine 180 MG tablet Commonly known as:  ALLEGRA Take 180 mg by mouth daily.   triamcinolone 55 MCG/ACT nasal inhaler Commonly known as:  NASACORT Place 2 sprays into the nose daily.       Past Medical History:  Diagnosis Date  . Allergic   . Hypertension     History reviewed. No pertinent surgical history.  No Known Allergies  Review of systems negative except as noted in HPI / PMHx or noted below:  Review of Systems  Constitutional: Negative.   HENT: Negative.   Eyes: Negative.   Respiratory: Negative.   Cardiovascular: Negative.   Gastrointestinal: Negative.   Genitourinary: Negative.     Musculoskeletal: Negative.   Skin: Negative.   Neurological: Negative.   Endo/Heme/Allergies: Negative.   Psychiatric/Behavioral: Negative.      Objective:   Vitals:   12/18/15 1750  BP: 134/82  Pulse: 76  Resp: 20   Height: 5\' 7"  (170.2 cm)  Weight: (!) 313 lb (142 kg)   Physical Exam  Constitutional: She is well-developed, well-nourished, and in no distress.  HENT:  Head: Normocephalic.  Right Ear: Tympanic membrane, external ear and ear canal normal.  Left Ear: Tympanic membrane, external ear and ear canal normal.  Nose: Nose normal. No mucosal edema or rhinorrhea.  Mouth/Throat: Uvula is midline, oropharynx is clear and moist and mucous membranes are normal. No oropharyngeal exudate.  Eyes: Conjunctivae are normal.  Neck: Trachea normal. No tracheal tenderness present. No tracheal deviation present. No thyromegaly present.  Cardiovascular: Normal rate, regular rhythm, S1 normal, S2 normal and normal heart sounds.   No murmur heard. Pulmonary/Chest: Breath sounds normal. No stridor. No respiratory distress. She has no wheezes. She has no rales.  Musculoskeletal: She exhibits no edema.  Lymphadenopathy:       Head (right side): No tonsillar adenopathy present.       Head (left side): No tonsillar adenopathy present.    She has no cervical adenopathy.  Neurological: She is alert. Gait normal.  Skin: No rash noted. She is not diaphoretic. No erythema. Nails show no clubbing.  Psychiatric: Mood and affect normal.    Diagnostics:    Spirometry  was performed and demonstrated an FEV1 of 2.93 at 117 % of predicted.  The patient had an Asthma Control Test with the following results: ACT Total Score: 24.    Assessment and Plan:   1. Asthma, well controlled, mild persistent   2. Allergic rhinoconjunctivitis   3. Gastroesophageal reflux disease, esophagitis presence not specified     1. Continue Qvar 80 2 inhalations one time per day  2. "Action plan" for asthma  flare including Qvar 80 3 inhalations 3 times a day plus Ventolin HFA 2 puffs every 4-6 hours  3. Continue OTC Nasacort 1-2 sprays each nostril once a day  4. Continue OTC antihistamine as needed  5. Obtain fall flu vaccine  6. Return to clinic in 1 year or earlier if problem  Stacey Shelton is doing wonderful and we'll continue to have her use anti-inflammatory medications for both her upper and lower airway and she will contact me should she have significant probably using this plan and I will see her back in this clinic in 1 year or earlier if there is a problem.  Stacey Katz, MD [facility]

## 2015-12-18 NOTE — Patient Instructions (Signed)
  1. Continue Qvar 80 2 inhalations one time per day  2. "Action plan" for asthma flare including Qvar 80 3 inhalations 3 times a day plus Ventolin HFA 2 puffs every 4-6 hours  3. Continue OTC Nasacort 1-2 sprays each nostril once a day  4. Continue OTC antihistamine as needed  5. Obtain fall flu vaccine  6. Return to clinic in 1 year or earlier if problem

## 2015-12-24 NOTE — Progress Notes (Signed)
This is not my patient. 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

## 2015-12-28 NOTE — Progress Notes (Signed)
Fixed.

## 2016-02-18 ENCOUNTER — Other Ambulatory Visit: Payer: Self-pay | Admitting: Allergy and Immunology

## 2016-02-25 ENCOUNTER — Telehealth: Payer: Self-pay | Admitting: Allergy and Immunology

## 2016-02-25 NOTE — Telephone Encounter (Signed)
Her work won't let her return until she has a not from you saying its ok for her to come back. She would like to return to work Wednesday if at all possible.  Thanks

## 2016-02-25 NOTE — Telephone Encounter (Signed)
Please provide patient note that she requires to restart work

## 2016-02-26 NOTE — Telephone Encounter (Signed)
Letter completed and patient aware

## 2016-03-10 ENCOUNTER — Telehealth: Payer: Self-pay

## 2016-03-10 NOTE — Telephone Encounter (Signed)
Spoke to patient states she got sick and had to increase her dose as written per Dr Neldon Mc and has ran out will leave sample at front desk to pick up Qvar 80

## 2016-03-10 NOTE — Telephone Encounter (Signed)
Patient stated her insurance will not let her get another refill on qvar till Dec 7. She is requesting samples. I see where [facility] sent in refills for qvar back on 02/18/16, not to sure why they patient is requesting another refill.  Please Advise  Thanks

## 2016-07-01 ENCOUNTER — Telehealth: Payer: Self-pay | Admitting: Allergy and Immunology

## 2016-07-01 NOTE — Telephone Encounter (Signed)
Patient last saw Dr. Neldon Mc 12-18-15. She said she has had a recent flare up of her asthma and had to use her Qvar more than normal. She said she cannot get it filled again until next month and is out. She would like to know if you have a sample.

## 2016-07-02 NOTE — Telephone Encounter (Signed)
Samples of Qvar 40 put up front. Instructed to inhale 4 puffs daily.

## 2016-07-02 NOTE — Telephone Encounter (Signed)
Please provide sample

## 2016-07-02 NOTE — Telephone Encounter (Signed)
Okay to provide sample?

## 2016-08-11 ENCOUNTER — Other Ambulatory Visit: Payer: Self-pay

## 2016-08-11 MED ORDER — FLUTICASONE PROPIONATE HFA 110 MCG/ACT IN AERO: 2.0000 | INHALATION_SPRAY | Freq: Every day | RESPIRATORY_TRACT | 5 refills | Status: DC

## 2016-10-24 ENCOUNTER — Other Ambulatory Visit: Payer: Self-pay

## 2016-10-24 MED ORDER — FLUTICASONE PROPIONATE HFA 110 MCG/ACT IN AERO: 2.0000 | INHALATION_SPRAY | Freq: Every day | RESPIRATORY_TRACT | 1 refills | Status: DC

## 2016-10-27 ENCOUNTER — Encounter: Payer: Self-pay | Admitting: Neurology

## 2016-10-28 NOTE — Progress Notes (Signed)
Called home number, patient unavailable. Called mobile number, straight to voicemail and it was full.

## 2017-03-04 ENCOUNTER — Ambulatory Visit: Payer: BLUE CROSS/BLUE SHIELD | Admitting: Allergy and Immunology

## 2017-03-04 ENCOUNTER — Encounter: Payer: Self-pay | Admitting: Allergy and Immunology

## 2017-03-04 VITALS — BP 128/88 | HR 68 | Resp 16

## 2017-03-04 DIAGNOSIS — K219 Gastro-esophageal reflux disease without esophagitis: Secondary | ICD-10-CM

## 2017-03-04 DIAGNOSIS — J4530 Mild persistent asthma, uncomplicated: Secondary | ICD-10-CM

## 2017-03-04 DIAGNOSIS — J3089 Other allergic rhinitis: Secondary | ICD-10-CM | POA: Diagnosis not present

## 2017-03-04 MED ORDER — OMEPRAZOLE 40 MG PO CPDR: 40.0000 mg | DELAYED_RELEASE_CAPSULE | Freq: Every day | ORAL | 5 refills | Status: DC

## 2017-03-04 NOTE — Progress Notes (Signed)
Follow-up Note  Referring Provider: Barrie Lyme, FNP Primary Provider: Barrie Lyme, FNP Date of Office Visit: 03/04/2017  Subjective:   Stacey Shelton (DOB: 06-28-1970) is a 46 y.o. female who returns to the [facility] on 03/04/2017 in re-evaluation of the following:  HPI: Stacey Shelton presents to this clinic in reevaluation of her asthma and allergic rhinoconjunctivitis and history of GERD.  I have not seen her in this clinic since 18 December 2015  She has done quite well with her asthma and has not required a systemic steroid to treat an exacerbation and rarely uses a short acting bronchodilator while continuing to use Flovent 1 time per day.  She has not had to activate her action plan.  Her nose is doing quite well at this point in time and she has not required an antibiotic to treat an episode of sinusitis.  She has redeveloped throat clearing and feeling as though there is a coating of mucus in her throat.  She does not have any classic reflux symptoms at this point.  She is not on any therapy for reflux.  She does drink caffeine on a daily basis and chocolate just about every other day.  She did receive the flu vaccine this year.  Allergies as of 03/04/2017   No Known Allergies     Medication List      fexofenadine 180 MG tablet Commonly known as:  ALLEGRA Take 180 mg by mouth daily.   fluticasone 110 MCG/ACT inhaler Commonly known as:  FLOVENT HFA Inhale 2 puffs into the lungs daily.   triamcinolone 55 MCG/ACT nasal inhaler Commonly known as:  NASACORT Place 2 sprays into the nose daily.   VITAMIN D PO Take by mouth.       Past Medical History:  Diagnosis Date  . Allergic   . Hypertension     History reviewed. No pertinent surgical history.  Review of systems negative except as noted in HPI / PMHx or noted below:  Review of Systems  Constitutional: Negative.   HENT: Negative.   Eyes: Negative.   Respiratory: Negative.     Cardiovascular: Negative.   Gastrointestinal: Negative.   Genitourinary: Negative.   Musculoskeletal: Negative.   Skin: Negative.   Neurological: Negative.   Endo/Heme/Allergies: Negative.   Psychiatric/Behavioral: Negative.      Objective:   Vitals:   03/04/17 1025  BP: 128/88  Pulse: 68  Resp: 16          Physical Exam  Constitutional: She is well-developed, well-nourished, and in no distress.  Throat clearing  HENT:  Head: Normocephalic.  Right Ear: Tympanic membrane, external ear and ear canal normal.  Left Ear: Tympanic membrane, external ear and ear canal normal.  Nose: Nose normal. No mucosal edema or rhinorrhea.  Mouth/Throat: Uvula is midline, oropharynx is clear and moist and mucous membranes are normal. No oropharyngeal exudate.  Eyes: Conjunctivae are normal.  Neck: Trachea normal. No tracheal tenderness present. No tracheal deviation present. No thyromegaly present.  Cardiovascular: Normal rate, regular rhythm, S1 normal, S2 normal and normal heart sounds.  No murmur heard. Pulmonary/Chest: Breath sounds normal. No stridor. No respiratory distress. She has no wheezes. She has no rales.  Musculoskeletal: She exhibits no edema.  Lymphadenopathy:       Head (right side): No tonsillar adenopathy present.       Head (left side): No tonsillar adenopathy present.    She has no cervical adenopathy.  Neurological: She is  alert. Gait normal.  Skin: No rash noted. She is not diaphoretic. No erythema. Nails show no clubbing.  Psychiatric: Mood and affect normal.    Diagnostics:    Spirometry was performed and demonstrated an FEV1 of 2.98 at 111 % of predicted.  The patient had an Asthma Control Test with the following results: ACT Total Score: 25.    Assessment and Plan:   1. Asthma, well controlled, mild persistent   2. Other allergic rhinitis   3. Gastroesophageal reflux disease, esophagitis presence not specified      1. Continue Flovent 110 2  inhalations one time per day  2. "Action plan" for asthma flare including Flovent 3 inhalations 3 times a day plus Ventolin HFA 2 puffs every 4-6 hours  3. Continue OTC Nasacort 1-2 sprays each nostril once a day  4. Continue OTC antihistamine as needed  5. Start therapy for reflux:   A. Consolidate all caffeine and chocolate consumption  B. Omeprazole 40mg  one tablet one time per day  C. Contact clinic in 4 weeks with update concerning response  6. Return to clinic in 1 year or earlier if problem  Stacey Shelton appears to be doing quite well with her atopic respiratory disease but it does sound as though her LPR is flaring We will address the issue with reflux by consolidating her caffeine and chocolate consumption and starting a proton pump inhibitor.  Our assumption is that she will be better within the next 4 weeks or so regarding this issue and she will contact this clinic noting her response and we will make a determination about further evaluation and treatment based upon this response.  Otherwise, she will continue to use anti-inflammatory agents for respiratory track as noted above and I will see her back in his clinic in 1 year or earlier if there is a problem.  Stacey Katz, MD Allergy / Immunology [place]

## 2017-03-04 NOTE — Patient Instructions (Signed)
  1. Continue Flovent 110 2 inhalations one time per day  2. "Action plan" for asthma flare including Flovent 3 inhalations 3 times a day plus Ventolin HFA 2 puffs every 4-6 hours  3. Continue OTC Nasacort 1-2 sprays each nostril once a day  4. Continue OTC antihistamine as needed  5. Start therapy for reflux:   A. Consolidate all caffeine and chocolate consumption  B. Omeprazole 40mg  one tablet one time per day  C. Contact clinic in 4 weeks with update concerning response  6. Return to clinic in 1 year or earlier if problem

## 2017-03-05 ENCOUNTER — Encounter: Payer: Self-pay | Admitting: Allergy and Immunology

## 2017-06-16 ENCOUNTER — Telehealth: Payer: Self-pay | Admitting: Allergy and Immunology

## 2017-06-16 NOTE — Telephone Encounter (Signed)
I spoke with patient and informed her that unfortunately we do not really get samples of Flovent or Ventolin.

## 2017-06-16 NOTE — Telephone Encounter (Signed)
Pt called to see if we had any samples of Flovent and Ventolin . She can not get any refills for about 2 weeks. [phone].

## 2017-08-26 ENCOUNTER — Other Ambulatory Visit: Payer: Self-pay | Admitting: Allergy and Immunology

## 2017-10-06 ENCOUNTER — Other Ambulatory Visit: Payer: Self-pay | Admitting: Allergy and Immunology

## 2017-10-13 ENCOUNTER — Ambulatory Visit: Payer: Managed Care, Other (non HMO) | Admitting: Allergy and Immunology

## 2017-10-13 ENCOUNTER — Encounter: Payer: Self-pay | Admitting: Allergy and Immunology

## 2017-10-13 VITALS — BP 128/78 | HR 72 | Resp 16

## 2017-10-13 DIAGNOSIS — K219 Gastro-esophageal reflux disease without esophagitis: Secondary | ICD-10-CM

## 2017-10-13 DIAGNOSIS — J3089 Other allergic rhinitis: Secondary | ICD-10-CM

## 2017-10-13 DIAGNOSIS — J4530 Mild persistent asthma, uncomplicated: Secondary | ICD-10-CM

## 2017-10-13 MED ORDER — FLUTICASONE PROPIONATE HFA 110 MCG/ACT IN AERO: INHALATION_SPRAY | RESPIRATORY_TRACT | 1 refills | Status: DC

## 2017-10-13 MED ORDER — OMEPRAZOLE 40 MG PO CPDR: DELAYED_RELEASE_CAPSULE | ORAL | 1 refills | Status: DC

## 2017-10-13 MED ORDER — ALBUTEROL SULFATE HFA 108 (90 BASE) MCG/ACT IN AERS: 2.0000 | INHALATION_SPRAY | RESPIRATORY_TRACT | 0 refills | Status: DC | PRN

## 2017-10-13 NOTE — Progress Notes (Signed)
Follow-up Note  Referring Provider: Barrie Lyme, FNP Primary Provider: Barrie Lyme, FNP Date of Office Visit: 10/13/2017  Subjective:   Stacey Shelton (DOB: Sep 29, 1970) is a 47 y.o. female who returns to the [facility] on 10/13/2017 in re-evaluation of the following:  HPI: Stacey Shelton returns to this clinic in reevaluation of her asthma and allergic rhinoconjunctivitis and reflux.  Her last visit to this clinic was 04 March 2017.    During her last visit she was having problems with LPR for which she started a proton pump inhibitor which has worked quite well taking care of this issue.  She still continues to drink caffeinated drinks.  Asthma has been under excellent control and she rarely uses a short acting bronchodilator and she has not required a systemic steroid to treat an exacerbation.  Likewise, her upper airways are really doing quite well and she has not required an antibiotic to treat an episode of sinusitis.  Allergies as of 10/13/2017   No Known Allergies     Medication List      albuterol 108 (90 Base) MCG/ACT inhaler Commonly known as:  PROVENTIL HFA Inhale 2 puffs into the lungs every 4 (four) hours as needed for wheezing or shortness of breath.   fexofenadine 180 MG tablet Commonly known as:  ALLEGRA Take 180 mg by mouth daily.   fluticasone 110 MCG/ACT inhaler Commonly known as:  FLOVENT HFA INHALE 2 PUFFS BY MOUTH EVERY DAY   omeprazole 40 MG capsule Commonly known as:  PRILOSEC TAKE 1 CAPSULE(40 MG) BY MOUTH DAILY   triamcinolone 55 MCG/ACT nasal inhaler Commonly known as:  NASACORT Place 2 sprays into the nose daily.       Past Medical History:  Diagnosis Date  . Allergic   . Hypertension     History reviewed. No pertinent surgical history.  Review of systems negative except as noted in HPI / PMHx or noted below:  Review of Systems  Constitutional: Negative.   HENT: Negative.   Eyes: Negative.   Respiratory:  Negative.   Cardiovascular: Negative.   Gastrointestinal: Negative.   Genitourinary: Negative.   Musculoskeletal: Negative.   Skin: Negative.   Neurological: Negative.   Endo/Heme/Allergies: Negative.   Psychiatric/Behavioral: Negative.      Objective:   Vitals:   10/13/17 1743  BP: 128/78  Pulse: 72  Resp: 16          Physical Exam  HENT:  Head: Normocephalic.  Right Ear: Tympanic membrane, external ear and ear canal normal.  Left Ear: Tympanic membrane, external ear and ear canal normal.  Nose: Nose normal. No mucosal edema or rhinorrhea.  Mouth/Throat: Uvula is midline, oropharynx is clear and moist and mucous membranes are normal. No oropharyngeal exudate.  Eyes: Conjunctivae are normal.  Neck: Trachea normal. No tracheal tenderness present. No tracheal deviation present. No thyromegaly present.  Cardiovascular: Normal rate, regular rhythm, S1 normal, S2 normal and normal heart sounds.  No murmur heard. Pulmonary/Chest: Breath sounds normal. No stridor. No respiratory distress. She has no wheezes. She has no rales.  Musculoskeletal: She exhibits no edema.  Lymphadenopathy:       Head (right side): No tonsillar adenopathy present.       Head (left side): No tonsillar adenopathy present.    She has no cervical adenopathy.  Neurological: She is alert.  Skin: No rash noted. She is not diaphoretic. No erythema. Nails show no clubbing.    Diagnostics:  Spirometry was performed and demonstrated an FEV1 of 2.01 at 76 % of predicted.  Assessment and Plan:   1. Asthma, well controlled, mild persistent   2. Other allergic rhinitis   3. LPRD (laryngopharyngeal reflux disease)      1. Continue Flovent 110 2 inhalations one time per day  2. "Action plan" for asthma flare including Flovent 3 inhalations 3 times a day plus Ventolin HFA 2 puffs every 4-6 hours  3. Continue OTC Nasacort 1-2 sprays each nostril once a day  4. Continue OTC antihistamine as needed  5.  Start therapy for reflux:   A. Consolidate all caffeine and chocolate consumption  B. Omeprazole 40mg  one tablet one time per day  6. Return to clinic in 1 year or earlier if problem  7. Obtain fall flu vaccine  [name] appears to be doing very well on her current therapy which includes relatively low dose inhaled steroids and nasal steroids directed at her respiratory tract inflammation and consistent therapy directed against reflux and she will continue on this plan and I will see her back in this clinic in 1 year or earlier if there is a problem.  [name], MD Allergy / Immunology [place]

## 2017-10-13 NOTE — Patient Instructions (Addendum)
  1. Continue Flovent 110 2 inhalations one time per day  2. "Action plan" for asthma flare including Flovent 3 inhalations 3 times a day plus Ventolin HFA 2 puffs every 4-6 hours  3. Continue OTC Nasacort 1-2 sprays each nostril once a day  4. Continue OTC antihistamine as needed  5. Start therapy for reflux:   A. Consolidate all caffeine and chocolate consumption  B. Omeprazole 40mg  one tablet one time per day  6. Return to clinic in 1 year or earlier if problem  7. Obtain fall flu vaccine

## 2017-10-14 ENCOUNTER — Encounter: Payer: Self-pay | Admitting: Allergy and Immunology

## 2017-11-19 ENCOUNTER — Telehealth: Payer: Self-pay | Admitting: Allergy and Immunology

## 2017-11-19 NOTE — Telephone Encounter (Signed)
Past few days patient has had issues with cough and congestion and now is having issues with her ears as well Has a few questions for the nurse Please call the patient at [phone] - leave a message if needed

## 2017-11-19 NOTE — Telephone Encounter (Signed)
I called the patient and left a voicemail asking them to call back to discuss symptoms.

## 2017-11-20 ENCOUNTER — Other Ambulatory Visit: Payer: Self-pay | Admitting: *Deleted

## 2017-11-20 MED ORDER — ALBUTEROL SULFATE HFA 108 (90 BASE) MCG/ACT IN AERS: 2.0000 | INHALATION_SPRAY | RESPIRATORY_TRACT | 1 refills | Status: DC | PRN

## 2017-11-20 NOTE — Telephone Encounter (Signed)
Please make sure she is doing the following per her plan:  -  "Action plan" for asthma flare including Flovent 3 inhalations 3 times a day plus Ventolin HFA 2 puffs every 4-6 hours - Nasacort 2 sprays each nostril and increase for time being to twice a day - Allegra daily  - Omeprazole 44m one tablet one time per day  Would also recommend performing nasal saline rinses (if she does not have one she can buy a [employer] rinse bottle kit from pharmacy) prior to use of Nasacort.   Let [place] how she is feeling on Monday.

## 2017-11-20 NOTE — Telephone Encounter (Signed)
I called and spoke with the patient and informed her of the plan. A refill for Albuterol is being sent in for her at her pharmacy. I asked her to call us if symptoms do not improve or if she has any questions.

## 2017-11-20 NOTE — Telephone Encounter (Signed)
Patient called back to speak to Ashleigh. She was in a room and I said I would have Ashleigh call her back regarding a message that was left yesterday.

## 2017-11-20 NOTE — Telephone Encounter (Signed)
I tried calling all three numbers back. She wasn't home, nobody answered her work number, and her cell phone goes straight to voicemail so I left a message on her voicemail asking for her to call us back.

## 2017-11-20 NOTE — Telephone Encounter (Signed)
Patient is complaining of a cough and congestion. They do not feel that the cough is asthma related. The Allegra once a day doesn't seem to be helping with symptoms. The patient is also taking Nasacort and they said they feel that it helps some with their congestion but not completely. She is also having issues with her L ear and that feels clogged. Will you please advise?

## 2017-11-23 NOTE — Telephone Encounter (Signed)
Patient called back wanted clarification on how to use albuterol writer reviewed medication with patient. Verbalized understanding

## 2018-03-08 ENCOUNTER — Other Ambulatory Visit: Payer: Self-pay

## 2018-03-08 MED ORDER — FLUTICASONE PROPIONATE HFA 110 MCG/ACT IN AERO: INHALATION_SPRAY | RESPIRATORY_TRACT | 1 refills | Status: DC

## 2018-03-08 NOTE — Progress Notes (Signed)
90 day supply requested for Flovent HFA. This has been sent with 1 additional refill.

## 2018-03-20 ENCOUNTER — Other Ambulatory Visit: Payer: Self-pay | Admitting: Allergy and Immunology

## 2018-10-14 ENCOUNTER — Other Ambulatory Visit: Payer: Self-pay | Admitting: Allergy and Immunology

## 2018-10-14 NOTE — Telephone Encounter (Signed)
Last refill will need ov

## 2018-10-18 ENCOUNTER — Other Ambulatory Visit: Payer: Self-pay | Admitting: *Deleted

## 2018-10-18 MED ORDER — FLOVENT HFA 110 MCG/ACT IN AERO: INHALATION_SPRAY | RESPIRATORY_TRACT | 0 refills | Status: DC

## 2018-10-18 NOTE — Telephone Encounter (Signed)
Refill sent in for 90 day requested by pharmacy due to insurance purpose

## 2019-03-11 ENCOUNTER — Telehealth: Payer: Self-pay

## 2019-03-11 NOTE — Telephone Encounter (Signed)
Attempted to call patient in regards to disability claims documents received from [employer] requesting documentation for absences beginning 03/14/19. Patient has not been seen in our practice since 09/2017. No answer on home phone and unable to leave voicemail on mobile phone. Will attempt to contact again at a later date.

## 2019-03-16 ENCOUNTER — Telehealth: Payer: Self-pay

## 2019-03-16 ENCOUNTER — Encounter: Payer: Self-pay | Admitting: Allergy

## 2019-03-16 ENCOUNTER — Other Ambulatory Visit: Payer: Self-pay

## 2019-03-16 ENCOUNTER — Ambulatory Visit: Payer: Managed Care, Other (non HMO) | Admitting: Allergy

## 2019-03-16 VITALS — BP 112/80 | HR 75 | Temp 97.3°F | Resp 16 | Ht 67.0 in

## 2019-03-16 DIAGNOSIS — J3089 Other allergic rhinitis: Secondary | ICD-10-CM | POA: Diagnosis not present

## 2019-03-16 DIAGNOSIS — K219 Gastro-esophageal reflux disease without esophagitis: Secondary | ICD-10-CM | POA: Diagnosis not present

## 2019-03-16 DIAGNOSIS — Z20822 Contact with and (suspected) exposure to covid-19: Secondary | ICD-10-CM

## 2019-03-16 DIAGNOSIS — J4541 Moderate persistent asthma with (acute) exacerbation: Secondary | ICD-10-CM | POA: Diagnosis not present

## 2019-03-16 MED ORDER — ALBUTEROL SULFATE HFA 108 (90 BASE) MCG/ACT IN AERS: 2.0000 | INHALATION_SPRAY | RESPIRATORY_TRACT | 0 refills | Status: AC | PRN

## 2019-03-16 MED ORDER — FLOVENT HFA 110 MCG/ACT IN AERO: INHALATION_SPRAY | RESPIRATORY_TRACT | 1 refills | Status: DC

## 2019-03-16 NOTE — Patient Instructions (Addendum)
Asthma: Start prednisone taper.  Please get COVID testing and quarantine yourself until you have the results back. Letter given to be excused from 11/13 to 11/27. Letter given regarding covid and asthma.   Recommend that you talk to your supervisor about the short term disability form. You should get a primary care doctor as well.  There are also government FMLA forms available if you need to take care of your elderly mother.  . Daily controller medication(s): Flovent 110 2 puffs daily with spacer and rinse mouth afterwards. . Prior to physical activity: May use albuterol rescue inhaler 2 puffs 5 to 15 minutes prior to strenuous physical activities. Marland Kitchen Rescue medications: May use albuterol rescue inhaler 2 puffs or nebulizer every 4 to 6 hours as needed for shortness of breath, chest tightness, coughing, and wheezing. Monitor frequency of use.  . During upper respiratory infections/asthma flares: Start Flovent 110 3 puffs three times a day for 1-2 weeks with spacer and rinse mouth afterwards. . Asthma control goals:  o Full participation in all desired activities (may need albuterol before activity) o Albuterol use two times or less a week on average (not counting use with activity) o Cough interfering with sleep two times or less a month o Oral steroids no more than once a year o No hospitalizations  . May use Nasacort 2 sprays per nostril daily as needed for sinus symptoms.  Follow up in 3 months with Dr. Neldon Mc or sooner if needed.

## 2019-03-16 NOTE — Progress Notes (Signed)
Follow Up Note  RE: Stacey Shelton MRN: [id number] DOB: [birth year] Date of Office Visit: 03/16/2019  Referring provider: Barrie Lyme, FNP Primary care provider: Patient, No Pcp Per  Chief Complaint: Asthma and Cough (mucus, clear/yellow )  History of Present Illness: I had the pleasure of seeing Stacey Shelton for a follow up visit at the [facility] on 03/17/2019. She is a 48 y.o. female, who is being followed for asthma, allergic rhinitis and LPRD. Today she is here for regular follow up visit and to fill out short term disability forms. Her previous allergy office visit was on 10/13/2017 with Dr. Neldon Mc.   Asthma/allergic rhinitis: Patient has been having issues with coughing since last Thursday. She increased her Flovent 110 to 3 puffs TID with no benefit.   The coughing is persistent and getting worse. It's usually worse in the morning. She has not used her albuterol for these episodes.   Prior to this she was using Flovent 110 2 puffs once a day. She hasn't had a major flare of her asthma like this which required prednisone since 2 years ago.   No recent prednisone.  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 She herself has not gotten COVID tested.  Denies any fevers or chills.  She did have some nasal congestion which resolved. Using Nasacort 2 sprays per nostril once a day.  Stopped reflux medications a few months ago as it was ineffective.  She also brought in paperwork for short term disability. She is concerned about working due to the positive cases of COVID-19 at work and she lives with her elderly mother and does not want to bring it back to her.   Patient does not have a PCP at this time and she has not been seen in our clinic for over 1 year.   Assessment and Plan: [name] is a 48 y.o. female with: Moderate persistent asthma with acute exacerbation Asthma controlled until last week  and started coughing. Increased Flovent 110 to 3 puffs TID with no benefit. Did not use albuterol. No prednisone use for 2 years. COVID-19 exposures at work and concerned about going to work given her asthma and living with elderly mother. Patient has no PCP.   Today's spirometry was unremarkable.   Start prednisone taper.   Start Flovent 110 3 puffs three times a day for 1-2 weeks with spacer and rinse mouth afterwards.  Advised patient to get COVID testing and quarantine until the results are back.  Wrote letter to be excused from 11/13 to 11/27 for current asthma exacerbation.   Wrote general letter regarding covid and asthma. Discussed with patient that she needs to discuss with her supervisor regarding the short term disability forms. I've only seen her once today for acute asthma exacerbation and it would not be appropriate for me to fill out her short term disability forms. I recommended that she needs to get established with a PCP as well. If there's a concern regarding her elderly mother, there are government FMLA forms available which needs to be filled out through her mother's physician and not [place].  . Daily controller medication(s): Flovent 110 2 puffs daily with spacer and rinse mouth afterwards. . Prior to physical activity: May use albuterol rescue inhaler 2 puffs 5 to 15 minutes prior to strenuous physical activities. Marland Kitchen Rescue medications: May use albuterol rescue inhaler 2 puffs or nebulizer every 4  to 6 hours as needed for shortness of breath, chest tightness, coughing, and wheezing. Monitor frequency of use.  . During upper respiratory infections/asthma flares: Start Flovent 110 3 puffs three times a day for 1-2 weeks with spacer and rinse mouth afterwards.  Other allergic rhinitis Stable.  May use Nasacort 2 sprays per nostril daily as needed for sinus symptoms.  LPRD (laryngopharyngeal reflux disease) Stopped omeprazole a few months ago with no worsening symptoms.   Monitor.   Return in about 3 months (around 06/16/2019).  Meds ordered this encounter  Medications  . albuterol (PROVENTIL HFA) 108 (90 Base) MCG/ACT inhaler    Sig: Inhale 2 puffs into the lungs every 4 (four) hours as needed for wheezing or shortness of breath.    Dispense:  54 g    Refill:  0  . fluticasone (FLOVENT HFA) 110 MCG/ACT inhaler    Sig: INHALE 2 PUFFS BY MOUTH EVERY DAY and increase to 3 puffs three times a day during asthma flare.    Dispense:  36 g    Refill:  1   Diagnostics: Spirometry:  Tracings reviewed. Her effort: It was hard to get consistent efforts and there is a question as to whether this reflects a maximal maneuver. FVC: 2.64L FEV1: 2.39L, 91% predicted FEV1/FVC ratio: 91% Interpretation: No overt abnormalities noted given today's efforts.  Please see scanned spirometry results for details.  Medication List:  Current Outpatient Medications  Medication Sig Dispense Refill  . albuterol (PROVENTIL HFA) 108 (90 Base) MCG/ACT inhaler Inhale 2 puffs into the lungs every 4 (four) hours as needed for wheezing or shortness of breath. 54 g 0  . fexofenadine (ALLEGRA) 180 MG tablet Take 180 mg by mouth daily.    . fluticasone (FLOVENT HFA) 110 MCG/ACT inhaler INHALE 2 PUFFS BY MOUTH EVERY DAY and increase to 3 puffs three times a day during asthma flare. 36 g 1  . guaiFENesin (MUCINEX) 600 MG 12 hr tablet Take 600 mg by mouth 2 (two) times daily.    Marland Kitchen triamcinolone (NASACORT) 55 MCG/ACT nasal inhaler Place 2 sprays into the nose daily.     No current facility-administered medications for this visit.    Allergies: No Known Allergies I reviewed her past medical history, social history, family history, and environmental history and no significant changes have been reported from her previous visit.  Review of Systems  Constitutional: Negative for appetite change, chills, fever and unexpected weight change.  HENT: Negative for congestion and rhinorrhea.   Eyes:  Negative for itching.  Respiratory: Positive for cough. Negative for chest tightness, shortness of breath and wheezing.   Gastrointestinal: Negative for abdominal pain.  Skin: Negative for rash.  Neurological: Negative for headaches.   Objective: BP 112/80   Pulse 75   Temp (!) 97.3 F (36.3 C) (Temporal)   Resp 16   Ht 5\' 7"  (1.702 m)   SpO2 97%   BMI 49.02 kg/m  Body mass index is 49.02 kg/m. Physical Exam  Constitutional: She is oriented to person, place, and time. She appears well-developed and well-nourished.  HENT:  Head: Normocephalic and atraumatic.  Right Ear: External ear normal.  Left Ear: External ear normal.  Nose: Nose normal.  Mouth/Throat: Oropharynx is clear and moist.  Eyes: Conjunctivae and EOM are normal.  Neck: Neck supple.  Cardiovascular: Normal rate, regular rhythm and normal heart sounds. Exam reveals no gallop and no friction rub.  No murmur heard. Pulmonary/Chest: Effort normal and breath sounds normal. She has no  wheezes. She has no rales.  Patient was coughing initially but then it subsided throughout the visit.  Neurological: She is alert and oriented to person, place, and time.  Skin: Skin is warm. No rash noted.  Psychiatric: She has a normal mood and affect. Her behavior is normal.  Nursing note and vitals reviewed.  Previous notes and tests were reviewed. The plan was reviewed with the patient/family, and all questions/concerned were addressed.  It was my pleasure to see Trenesha today and participate in her care. Please feel free to contact me with any questions or concerns.  Sincerely,  Rexene Alberts, DO Allergy & Immunology  [facility] office: [phone] [facility] office: [place] office: [phone]

## 2019-03-16 NOTE — Telephone Encounter (Signed)
There is really not enough information regarding why short-term disability form needs to be filled out and thus I think we need to wait until her evaluation with Dr. Maudie Mercury is completed and then we can decide what to do.  Have I filled out a short-term disability form in the past???

## 2019-03-16 NOTE — Telephone Encounter (Signed)
Per Dr. Maudie Mercury disregard last message, she is handling it!

## 2019-03-16 NOTE — Telephone Encounter (Signed)
Patient comes into clinic to be seen with Dr. Maudie Mercury for her cough. Patient instructed me to give to Dr. Maudie Mercury to fill out short-term disability form. Patient last seen was 09/2017 with Dr. Neldon Mc, and unsure to fill out forms at this time unless instructed to do so. Will wait for further instructions regarding forms. Advise patient to allow 72 business hours. Dr. Neldon Mc please advise

## 2019-03-17 ENCOUNTER — Encounter: Payer: Self-pay | Admitting: Allergy

## 2019-03-17 DIAGNOSIS — J3089 Other allergic rhinitis: Secondary | ICD-10-CM | POA: Insufficient documentation

## 2019-03-17 DIAGNOSIS — K219 Gastro-esophageal reflux disease without esophagitis: Secondary | ICD-10-CM | POA: Insufficient documentation

## 2019-03-17 DIAGNOSIS — J4541 Moderate persistent asthma with (acute) exacerbation: Secondary | ICD-10-CM | POA: Insufficient documentation

## 2019-03-17 NOTE — Assessment & Plan Note (Signed)
Stable.  May use Nasacort 2 sprays per nostril daily as needed for sinus symptoms.

## 2019-03-17 NOTE — Assessment & Plan Note (Signed)
Stopped omeprazole a few months ago with no worsening symptoms.  Monitor.

## 2019-03-17 NOTE — Assessment & Plan Note (Addendum)
Asthma controlled until last week and started coughing. Increased Flovent 110 to 3 puffs TID with no benefit. Did not use albuterol. No prednisone use for 2 years. COVID-19 exposures at work and concerned about going to work given her asthma and living with elderly mother. Patient has no PCP.   Today's spirometry was unremarkable.   Start prednisone taper.   Start Flovent 110 3 puffs three times a day for 1-2 weeks with spacer and rinse mouth afterwards.  Advised patient to get COVID testing and quarantine until the results are back.  Wrote letter to be excused from 11/13 to 11/27 for current asthma exacerbation.   Wrote general letter regarding covid and asthma. Discussed with patient that she needs to discuss with her supervisor regarding the short term disability forms. I've only seen her once today for acute asthma exacerbation and it would not be appropriate for me to fill out her short term disability forms. I recommended that she needs to get established with a PCP as well. If there's a concern regarding her elderly mother, there are government FMLA forms available which needs to be filled out through her mother's physician and not [place].  . Daily controller medication(s): Flovent 110 2 puffs daily with spacer and rinse mouth afterwards. . Prior to physical activity: May use albuterol rescue inhaler 2 puffs 5 to 15 minutes prior to strenuous physical activities. Marland Kitchen Rescue medications: May use albuterol rescue inhaler 2 puffs or nebulizer every 4 to 6 hours as needed for shortness of breath, chest tightness, coughing, and wheezing. Monitor frequency of use.  . During upper respiratory infections/asthma flares: Start Flovent 110 3 puffs three times a day for 1-2 weeks with spacer and rinse mouth afterwards.

## 2019-03-18 ENCOUNTER — Telehealth: Payer: Self-pay | Admitting: *Deleted

## 2019-03-18 LAB — NOVEL CORONAVIRUS, NAA: SARS-CoV-2, NAA: DETECTED — AB

## 2019-03-18 NOTE — Telephone Encounter (Signed)
Please call patient.  Looks like someone attempted to call her about the results today.  "Notes recorded by Fenton Foy, NP on 03/18/2019 at 7:36 AM EST  Attempted to contact patient. Someone answered the phone and hung up. Will notify health department."  Please advise her that she needs to be in strict isolation.   As per CDC guidelines: At least 10 days since symptoms first appeared and At least 24 hours with no fever without fever-reducing medication and Other symptoms of COVID-19 are improving**Loss of taste and smell may persist for weeks or months after recovery and need not delay the end of isolation?  Stay home except to get medical care Monitor your symptoms. If you have an emergency warning sign (including trouble breathing), seek emergency medical care immediately Stay in a separate room from other household members, if possible Use a separate bathroom, if possible Avoid contact with other members of the household and pets Don't share personal household items, like cups, towels, and utensils Wear a mask when around other people, if you are able to

## 2019-03-18 NOTE — Telephone Encounter (Addendum)
Called and spoke with patient.  Reviewed results and isolation precautions in detail per Dr. Maudie Mercury.  Reviewed CDC guidelines and COVID-19 symptoms.  Discussed avoiding contact as much as possible with her mother who does live with her and wearing a mask when she does have to be around her.  Patient does have a separate bathroom she can use and does have a sister who can deliver groceries and supplies and leave outside for them.   Patient will monitor her symptoms closely and will seek medical care immediately if she worsens. Patient is aware Heath Department will be notified according to notation made from call placed to contact patient this morning regarding results. Patient has already notified her place of employment.  Patient verbalized understanding.

## 2019-03-18 NOTE — Telephone Encounter (Signed)
Patient called office stating she had received results from Covid testing and did not know if she was positive or negative. Patient wants to discuss with Dr.Kozlow or Dr. Maudie Mercury.  Informed patient results were not released to [place] yet.  Patient states she is feeling a little better and her cough is better at night.

## 2019-03-21 ENCOUNTER — Telehealth: Payer: Self-pay

## 2019-03-21 ENCOUNTER — Telehealth: Payer: Self-pay | Admitting: Allergy and Immunology

## 2019-03-21 NOTE — Telephone Encounter (Signed)
Short term disability paperwork received via fax. Per Dr. Maudie Mercury this will not be completed by our office. I left the patient a message for her to call back and discuss.

## 2019-03-21 NOTE — Telephone Encounter (Signed)
It looks like Stacey Shelton was just prescribed prednisone and has already activated her action plan since her visit with Dr. Maudie Mercury on the 18th.  There is probably no additional therapy at this point in time.  Make sure that Stacey Shelton remains well-hydrated through this ordeal.  If Stacey Shelton gets sicker then Stacey Shelton will probably require further evaluation in the ER setting.

## 2019-03-21 NOTE — Telephone Encounter (Signed)
Called patient and advised. Patient verbalized understanding.  

## 2019-03-21 NOTE — Telephone Encounter (Signed)
Dr Kozlow please advise 

## 2019-03-21 NOTE — Telephone Encounter (Signed)
I spoke with Stacey Shelton at [facility]. She told me that they inform the patient to contact their PCP for forms to be completed.

## 2019-03-21 NOTE — Telephone Encounter (Signed)
Patient called stating that she recently tested positive for COVID and is coughing a lot. Patient would like to know if Dr. Neldon Mc could recommend other medications to help with this cough. Patient is currently taking Allegra and continuing her inhaler 3 puffs,  3 times a day.  Uses [facility] on [address] and [address].   Please advise.

## 2019-03-29 NOTE — Telephone Encounter (Signed)
Patient was notified and aware we will get documents faxed back to our office or will bring them in.

## 2019-03-29 NOTE — Telephone Encounter (Signed)
So this message and deliver of information makes no sense at all. Please read through this message and train and let me know if this makes sense to you.  Contact patient today and inform her that there was a miscommunication and I will be happy to review her forms from her previous evaluation.   Please get me those forms.

## 2019-03-29 NOTE — Telephone Encounter (Signed)
Patient called and wanted to know why Dr. Neldon Mc will not fill out disability paperwork since he is her primary allergist. Dr. Maudie Mercury had refuse to fill out paper work due to her sx. Please advise if we are going to fill out paperwork.

## 2019-03-30 ENCOUNTER — Telehealth: Payer: Self-pay

## 2019-03-30 NOTE — Telephone Encounter (Signed)
Called and advised to patient. Patient verbalized understanding.  

## 2019-03-30 NOTE — Telephone Encounter (Signed)
Spoke with Jarrett Soho in [place] and advised that the FMLA paperwork is being faxed to [facility] in order to be completed so that the paperwork not being delayed.

## 2019-03-30 NOTE — Telephone Encounter (Signed)
Please inform patient that current recommendations are not to get retested for Covid.  Pieces of the virus will remain around for a while and it will make the test turn positive but it does not mean that she is still infective.

## 2019-03-30 NOTE — Telephone Encounter (Signed)
Patient wants to know if she needs to retested due to her recent positive testing on 03/08/2019 for COVID?

## 2019-03-30 NOTE — Telephone Encounter (Signed)
Stacey Shelton

## 2019-03-30 NOTE — Telephone Encounter (Signed)
Thank You, Jarrett Soho.

## 2019-03-30 NOTE — Telephone Encounter (Signed)
FMLA paperwork given to Dr.Kozlow.

## 2019-03-31 ENCOUNTER — Telehealth: Payer: Self-pay | Admitting: *Deleted

## 2019-03-31 NOTE — Telephone Encounter (Signed)
Please provide patient a note for work as requested.

## 2019-03-31 NOTE — Telephone Encounter (Addendum)
Patient called office and states she is feeling better and will be returning to work on 04/06/19 and is being allowed to work from home.  Patient needs a letter releasing her to return to work on 04/06/19.

## 2019-04-04 NOTE — Telephone Encounter (Signed)
Letter has been written and is available on Mychart for the patient. She is aware.

## 2019-10-02 ENCOUNTER — Other Ambulatory Visit: Payer: Self-pay | Admitting: Allergy

## 2019-10-03 ENCOUNTER — Telehealth: Payer: Self-pay

## 2019-10-03 NOTE — Telephone Encounter (Signed)
Sent in curtesy refill tried calling pt but the number on file is diconnected. Pt needs office visit before next refill is given

## 2020-02-10 ENCOUNTER — Telehealth: Payer: Self-pay | Admitting: Allergy and Immunology

## 2020-02-10 NOTE — Telephone Encounter (Signed)
Patient just changed insurances to [facility], which we do not take. Patient is almost out of her Flovent and would like to know if Dr. Neldon Mc can write her a prescription.  Please advise.

## 2020-02-10 NOTE — Telephone Encounter (Signed)
Spoke to patient and notified her that since it has been over 6 months since she has been seen last we would not be able to send in another refill. I advised her that she should go to her PCP to get more medication. Patient verbally acknowledged she understood.

## 2020-02-13 ENCOUNTER — Other Ambulatory Visit: Payer: Self-pay

## 2020-02-13 MED ORDER — FLOVENT HFA 110 MCG/ACT IN AERO: INHALATION_SPRAY | RESPIRATORY_TRACT | 0 refills | Status: DC

## 2020-02-13 NOTE — Telephone Encounter (Signed)
Patient called and wanted to know if we have any samples of Flovent that she could get. Patient needs medication, but has out of [employer].  Please advise.

## 2020-02-13 NOTE — Telephone Encounter (Signed)
Refill sent in  for 3 months. Patient was notified and verbalized understanding.

## 2020-02-13 NOTE — Telephone Encounter (Signed)
Please inform Stacey Shelton that week can refill her Flovent for 3 months until she gets settled with her new insurance and Dr.  Parks Ranger is assuming that her insurance will pay for prescription that we write.  We do not have any samples of Flovent.

## 2020-08-19 ENCOUNTER — Other Ambulatory Visit: Payer: Self-pay | Admitting: Allergy and Immunology

## 2020-10-17 ENCOUNTER — Other Ambulatory Visit: Payer: Self-pay | Admitting: Allergy

## 2021-04-16 ENCOUNTER — Other Ambulatory Visit: Payer: Self-pay | Admitting: Gastroenterology

## 2021-05-30 ENCOUNTER — Encounter (HOSPITAL_COMMUNITY): Payer: Self-pay | Admitting: Gastroenterology

## 2021-05-30 NOTE — Progress Notes (Signed)
Attempted to obtain medical history via telephone, unable to reach at this time. I left a voicemail to return pre surgical testing department's phone call.  

## 2021-06-06 NOTE — Anesthesia Preprocedure Evaluation (Addendum)
Anesthesia Evaluation  Patient identified by MRN, date of birth, ID band Patient awake    Reviewed: Allergy & Precautions, NPO status , Patient's Chart, lab work & pertinent test results  History of Anesthesia Complications Negative for: history of anesthetic complications  Airway Mallampati: I  TM Distance: >3 FB Neck ROM: Full    Dental  (+) Missing,    Pulmonary asthma ,    Pulmonary exam normal        Cardiovascular hypertension, Normal cardiovascular exam     Neuro/Psych negative neurological ROS  negative psych ROS   GI/Hepatic negative GI ROS, Neg liver ROS,   Endo/Other  Morbid obesity  Renal/GU negative Renal ROS  negative genitourinary   Musculoskeletal negative musculoskeletal ROS (+)   Abdominal   Peds  Hematology negative hematology ROS (+)   Anesthesia Other Findings Day of surgery medications reviewed with patient.  Reproductive/Obstetrics negative OB ROS                            Anesthesia Physical Anesthesia Plan  ASA: 3  Anesthesia Plan: MAC   Post-op Pain Management: Minimal or no pain anticipated   Induction:   PONV Risk Score and Plan: 2 and Treatment may vary due to age or medical condition and Propofol infusion  Airway Management Planned: Natural Airway and Simple Face Mask  Additional Equipment: None  Intra-op Plan:   Post-operative Plan:   Informed Consent: I have reviewed the patients History and Physical, chart, labs and discussed the procedure including the risks, benefits and alternatives for the proposed anesthesia with the patient or authorized representative who has indicated his/her understanding and acceptance.       Plan Discussed with: CRNA  Anesthesia Plan Comments:        Anesthesia Quick Evaluation

## 2021-06-07 ENCOUNTER — Ambulatory Visit (HOSPITAL_BASED_OUTPATIENT_CLINIC_OR_DEPARTMENT_OTHER): Payer: Managed Care, Other (non HMO) | Admitting: Anesthesiology

## 2021-06-07 ENCOUNTER — Encounter (HOSPITAL_COMMUNITY)
Admission: RE | Disposition: A | Discharge status: Home / Self Care | Payer: Self-pay | Source: Home / Self Care | Attending: Gastroenterology

## 2021-06-07 ENCOUNTER — Encounter (HOSPITAL_COMMUNITY): Payer: Self-pay | Admitting: Gastroenterology

## 2021-06-07 ENCOUNTER — Other Ambulatory Visit: Payer: Self-pay

## 2021-06-07 ENCOUNTER — Ambulatory Visit (HOSPITAL_COMMUNITY): Payer: Managed Care, Other (non HMO) | Admitting: Anesthesiology

## 2021-06-07 ENCOUNTER — Ambulatory Visit (HOSPITAL_COMMUNITY)
Admission: RE | Admit: 2021-06-07 | Discharge: 2021-06-07 | Disposition: A | Discharge status: Home / Self Care | Payer: Managed Care, Other (non HMO) | Attending: Gastroenterology | Admitting: Gastroenterology

## 2021-06-07 DIAGNOSIS — Z1211 Encounter for screening for malignant neoplasm of colon: Secondary | ICD-10-CM | POA: Insufficient documentation

## 2021-06-07 DIAGNOSIS — D509 Iron deficiency anemia, unspecified: Secondary | ICD-10-CM | POA: Diagnosis not present

## 2021-06-07 DIAGNOSIS — K635 Polyp of colon: Secondary | ICD-10-CM

## 2021-06-07 DIAGNOSIS — D124 Benign neoplasm of descending colon: Secondary | ICD-10-CM | POA: Diagnosis not present

## 2021-06-07 DIAGNOSIS — J45909 Unspecified asthma, uncomplicated: Secondary | ICD-10-CM | POA: Diagnosis not present

## 2021-06-07 DIAGNOSIS — Z6841 Body Mass Index (BMI) 40.0 and over, adult: Secondary | ICD-10-CM | POA: Insufficient documentation

## 2021-06-07 DIAGNOSIS — I10 Essential (primary) hypertension: Secondary | ICD-10-CM

## 2021-06-07 HISTORY — PX: COLONOSCOPY WITH PROPOFOL: SHX5780

## 2021-06-07 HISTORY — PX: POLYPECTOMY: SHX5525

## 2021-06-07 SURGERY — COLONOSCOPY WITH PROPOFOL
Anesthesia: Monitor Anesthesia Care

## 2021-06-07 MED ORDER — LIDOCAINE 2% (20 MG/ML) 5 ML SYRINGE
INTRAMUSCULAR | Status: DC | PRN
  Administered 2021-06-07: 100 mg via INTRAVENOUS

## 2021-06-07 MED ORDER — PROPOFOL 500 MG/50ML IV EMUL
INTRAVENOUS | Status: DC | PRN
  Administered 2021-06-07: 100 ug/kg/min via INTRAVENOUS

## 2021-06-07 MED ORDER — SODIUM CHLORIDE 0.9 % IV SOLN
INTRAVENOUS | Status: DC
Start: 2021-06-07 — End: 2021-06-07

## 2021-06-07 MED ORDER — LACTATED RINGERS IV SOLN
INTRAVENOUS | Status: AC | PRN
  Administered 2021-06-07: 1000 mL via INTRAVENOUS

## 2021-06-07 MED ORDER — PROPOFOL 10 MG/ML IV BOLUS
INTRAVENOUS | Status: DC | PRN
Start: 2021-06-07 — End: 2021-06-07
  Administered 2021-06-07: 30 mg via INTRAVENOUS

## 2021-06-07 SURGICAL SUPPLY — 22 items

## 2021-06-07 NOTE — Op Note (Signed)
[facility] Patient Name: Stacey Shelton Procedure Date: 06/07/2021 MRN: [id number] Attending MD: Carol Ada , MD Date of Birth: March 25, 1971 CSN: [id number] Age: 51 Admit Type: Outpatient Procedure:                Colonoscopy Indications:              Screening for colorectal malignant neoplasm Providers:                Carol Ada, MD, Mikey College, RN, Benetta Spar, Technician Referring MD:              Medicines:                Propofol per Anesthesia Complications:            No immediate complications. Estimated Blood Loss:     Estimated blood loss: none. Procedure:                Pre-Anesthesia Assessment:                           - Prior to the procedure, a History and Physical                            was performed, and patient medications and                            allergies were reviewed. The patient's tolerance of                            previous anesthesia was also reviewed. The risks                            and benefits of the procedure and the sedation                            options and risks were discussed with the patient.                            All questions were answered, and informed consent                            was obtained. Prior Anticoagulants: The patient has                            taken no previous anticoagulant or antiplatelet                            agents. ASA Grade Assessment: III - A patient with                            severe systemic disease. After reviewing the risks  and benefits, the patient was deemed in                            satisfactory condition to undergo the procedure.                           - Sedation was administered by an anesthesia                            professional. Deep sedation was attained.                           After obtaining informed consent, the colonoscope                            was passed under direct  vision. Throughout the                            procedure, the patient's blood pressure, pulse, and                            oxygen saturations were monitored continuously. The                            CF-HQ190L ([id number] Olympus colonoscope was                            introduced through the anus and advanced to the the                            cecum, identified by appendiceal orifice and                            ileocecal valve. The colonoscopy was performed                            without difficulty. The patient tolerated the                            procedure well. The quality of the bowel                            preparation was evaluated using the BBPS [facility]                            Bowel Preparation Scale) with scores of: Right                            Colon = 2 (minor amount of residual staining, small                            fragments of stool and/or opaque liquid, but mucosa  seen well), Transverse Colon = 2 (minor amount of                            residual staining, small fragments of stool and/or                            opaque liquid, but mucosa seen well) and Left Colon                            = 2 (minor amount of residual staining, small                            fragments of stool and/or opaque liquid, but mucosa                            seen well). The total BBPS score equals 6. The                            quality of the bowel preparation was good. The                            ileocecal valve, appendiceal orifice, and rectum                            were photographed. Scope In: 8:26:34 AM Scope Out: 8:54:44 AM Scope Withdrawal Time: 0 hours 18 minutes 5 seconds  Total Procedure Duration: 0 hours 28 minutes 10 seconds  Findings:      A 2 mm polyp was found in the hepatic flexure. The polyp was sessile.       The polyp was removed with a cold snare. Resection and retrieval were       complete.      A  5 mm polyp was found in the descending colon. The polyp was       pedunculated. The polyp was removed with a hot snare. Resection and       retrieval were complete. Impression:               - One 2 mm polyp at the hepatic flexure, removed                            with a cold snare. Resected and retrieved.                           - One 5 mm polyp in the descending colon, removed                            with a hot snare. Resected and retrieved. Moderate Sedation:      Not Applicable - Patient had care per Anesthesia. Recommendation:           - Patient has a contact number available for                            emergencies. The signs and symptoms of potential  delayed complications were discussed with the                            patient. Return to normal activities tomorrow.                            Written discharge instructions were provided to the                            patient.                           - Resume previous diet.                           - Continue present medications.                           - Await pathology results.                           - Repeat colonoscopy in 7 years for surveillance. Procedure Code(s):        --- Professional ---                           [phone], Colonoscopy, flexible; with removal of                            tumor(s), polyp(s), or other lesion(s) by snare                            technique Diagnosis Code(s):        --- Professional ---                           Z12.11, Encounter for screening for malignant                            neoplasm of colon                           K63.5, Polyp of colon CPT copyright 2019 [employer]. All rights reserved. The codes documented in this report are preliminary and upon coder review may  be revised to meet current compliance requirements. Carol Ada, MD Carol Ada, MD 06/07/2021 8:56:48 AM This report has been signed  electronically. Number of Addenda: 0

## 2021-06-07 NOTE — Discharge Instructions (Signed)

## 2021-06-07 NOTE — H&P (Signed)
Stacey Shelton HPI:  This 51 year old black female presents to the office for colorectal cancer screening. She has 2 BM's per day with no obvious blood or mucus in the stool. She has heavy menstrual cycles. She takes Iron 65 mg daily for iron deficiency anemia. She has a good appetite and her weight has been stable. She denies having any complaints of abdominal pain, nausea, vomiting, acid reflux, dysphagia or odynophagia. She denies having a family history of colon cancer, celiac sprue or IBD.  Past Medical History:  Diagnosis Date   Allergic    Hypertension     History reviewed. No pertinent surgical history.  Family History  Problem Relation Age of Onset   Diabetes Mother    Hyperlipidemia Mother     Social History:  reports that she has never smoked. She has never used smokeless tobacco. She reports current alcohol use. She reports that she does not use drugs.  Allergies:  Allergies  Allergen Reactions   Shellfish-Derived Products Other (See Comments)    Allergy testing    Medications: Scheduled: Continuous:  sodium chloride      No results found for this or any previous visit (from the past 24 hour(s)).   No results found.  ROS:  As stated above in the HPI otherwise negative.  There were no vitals taken for this visit.    PE: Gen: NAD, Alert and Oriented HEENT:  [place]/AT, EOMI Neck: Supple, no LAD Lungs: CTA Bilaterally CV: RRR without M/G/R ABD: Soft, NTND, +BS Ext: No C/C/E  Assessment/Plan: 1) Screening colonoscopy.  Chou Busler D 06/07/2021, 7:27 AM

## 2021-06-07 NOTE — Transfer of Care (Signed)
Immediate Anesthesia Transfer of Care Note  Patient: Stacey Shelton  Procedure(s) Performed: COLONOSCOPY WITH PROPOFOL POLYPECTOMY  Patient Location: PACU and Endoscopy Unit  Anesthesia Type:MAC  Level of Consciousness: awake and patient cooperative  Airway & Oxygen Therapy: Patient Spontanous Breathing  Post-op Assessment: Report given to RN and Post -op Vital signs reviewed and stable  Post vital signs: Reviewed and stable  Last Vitals:  Vitals Value Taken Time  BP    Temp    Pulse 85 06/07/21 0859  Resp 27 06/07/21 0859  SpO2 99 % 06/07/21 0859  Vitals shown include unvalidated device data.  Last Pain:  Vitals:   06/07/21 0743  TempSrc: Temporal  PainSc: 0-No pain         Complications: No notable events documented.

## 2021-06-07 NOTE — Anesthesia Postprocedure Evaluation (Signed)
Anesthesia Post Note  Patient: Stacey SOBOTTA  Procedure(s) Performed: COLONOSCOPY WITH PROPOFOL POLYPECTOMY     Patient location during evaluation: PACU Anesthesia Type: MAC Level of consciousness: awake and alert Pain management: pain level controlled Vital Signs Assessment: post-procedure vital signs reviewed and stable Respiratory status: spontaneous breathing, nonlabored ventilation and respiratory function stable Cardiovascular status: blood pressure returned to baseline Postop Assessment: no apparent nausea or vomiting Anesthetic complications: no   No notable events documented.  Last Vitals:  Vitals:   06/07/21 0910 06/07/21 0920  BP: (!) 167/88 (!) 172/95  Pulse: 70 73  Resp: (!) 25 (!) 29  Temp:    SpO2: 99% 97%    Last Pain:  Vitals:   06/07/21 0920  TempSrc:   PainSc: 0-No pain                 Marthenia Rolling

## 2021-06-10 ENCOUNTER — Encounter (HOSPITAL_COMMUNITY): Payer: Self-pay | Admitting: Gastroenterology

## 2021-06-10 LAB — SURGICAL PATHOLOGY

## 2021-06-21 ENCOUNTER — Other Ambulatory Visit: Payer: Self-pay | Admitting: Obstetrics and Gynecology

## 2021-07-30 ENCOUNTER — Ambulatory Visit (HOSPITAL_COMMUNITY)
Admission: RE | Admit: 2021-07-30 | Payer: Managed Care, Other (non HMO) | Source: Home / Self Care | Admitting: Obstetrics and Gynecology

## 2021-07-30 ENCOUNTER — Encounter (HOSPITAL_COMMUNITY): Admission: RE | Payer: Self-pay | Source: Home / Self Care

## 2021-07-30 SURGERY — EXAM UNDER ANESTHESIA
Anesthesia: Choice

## 2022-04-15 ENCOUNTER — Other Ambulatory Visit: Payer: Self-pay | Admitting: Obstetrics and Gynecology

## 2022-06-04 ENCOUNTER — Ambulatory Visit (HOSPITAL_COMMUNITY)
Admission: RE | Admit: 2022-06-04 | Payer: Managed Care, Other (non HMO) | Source: Home / Self Care | Admitting: Obstetrics and Gynecology

## 2022-06-04 ENCOUNTER — Encounter (HOSPITAL_COMMUNITY): Admission: RE | Payer: Self-pay | Source: Home / Self Care

## 2022-06-04 SURGERY — EXAM UNDER ANESTHESIA
Anesthesia: Choice

## 2022-10-31 ENCOUNTER — Other Ambulatory Visit: Payer: Self-pay

## 2022-10-31 ENCOUNTER — Ambulatory Visit
Admission: RE | Admit: 2022-10-31 | Discharge: 2022-10-31 | Disposition: A | Discharge status: Home / Self Care | Payer: Managed Care, Other (non HMO) | Source: Ambulatory Visit | Attending: Nurse Practitioner | Admitting: Nurse Practitioner

## 2022-10-31 DIAGNOSIS — M25561 Pain in right knee: Secondary | ICD-10-CM

## 2023-11-24 ENCOUNTER — Encounter (HOSPITAL_COMMUNITY): Payer: Self-pay

## 2023-11-24 ENCOUNTER — Emergency Department (HOSPITAL_COMMUNITY)
Admission: EM | Admit: 2023-11-24 | Discharge: 2023-11-25 | Discharge status: Against Medical Advice | Attending: Student | Admitting: Student

## 2023-11-24 ENCOUNTER — Other Ambulatory Visit: Payer: Self-pay

## 2023-11-24 DIAGNOSIS — Z5321 Procedure and treatment not carried out due to patient leaving prior to being seen by health care provider: Secondary | ICD-10-CM | POA: Insufficient documentation

## 2023-11-24 DIAGNOSIS — R1110 Vomiting, unspecified: Secondary | ICD-10-CM | POA: Diagnosis present

## 2023-11-24 LAB — COMPREHENSIVE METABOLIC PANEL WITH GFR
ALT: 19 U/L (ref 0–44)
AST: 18 U/L (ref 15–41)
Albumin: 4 g/dL (ref 3.5–5.0)
Alkaline Phosphatase: 62 U/L (ref 38–126)
Anion gap: 9 (ref 5–15)
BUN: 7 mg/dL (ref 6–20)
CO2: 25 mmol/L (ref 22–32)
Calcium: 9.8 mg/dL (ref 8.9–10.3)
Chloride: 104 mmol/L (ref 98–111)
Creatinine, Ser: 1.18 mg/dL — ABNORMAL HIGH (ref 0.44–1.00)
GFR, Estimated: 55 mL/min — ABNORMAL LOW (ref >60)
Glucose, Bld: 90 mg/dL (ref 70–99)
Potassium: 3.1 mmol/L — ABNORMAL LOW (ref 3.5–5.1)
Sodium: 138 mmol/L (ref 135–145)
Total Bilirubin: 1 mg/dL (ref 0.0–1.2)
Total Protein: 7.1 g/dL (ref 6.5–8.1)

## 2023-11-24 LAB — CBC
HCT: 42 % (ref 36.0–46.0)
Hemoglobin: 13.5 g/dL (ref 12.0–15.0)
MCH: 31 pg (ref 26.0–34.0)
MCHC: 32.1 g/dL (ref 30.0–36.0)
MCV: 96.6 fL (ref 80.0–100.0)
Platelets: 243 K/uL (ref 150–400)
RBC: 4.35 MIL/uL (ref 3.87–5.11)
RDW: 13.1 % (ref 11.5–15.5)
WBC: 9.7 K/uL (ref 4.0–10.5)
nRBC: 0 % (ref 0.0–0.2)

## 2023-11-24 LAB — LIPASE, BLOOD: Lipase: 23 U/L (ref 11–51)

## 2023-11-24 MED ORDER — ONDANSETRON HCL 4 MG/2ML IJ SOLN
4.0000 mg | Freq: Once | INTRAMUSCULAR | Status: AC
  Administered 2023-11-24: 4 mg via INTRAVENOUS
  Filled 2023-11-24: qty 2

## 2023-11-24 NOTE — ED Triage Notes (Signed)
 PT arrived from home via [facility]  c/o n/v, PT states first episode of emesis this morning. X4 episodes at present. Pt denies abd pain

## 2023-11-24 NOTE — ED Provider Triage Note (Signed)
 Emergency Medicine Provider Triage Evaluation Note  ADALEY KIENE , a 53 y.o. female  was evaluated in triage.  Pt complains of vomiting.  States that this morning she had a single episode of vomiting that improved after her episode of emesis.  However, her symptoms returned this evening and she has vomited 4 additional times.  No blood in the vomit.  Denies abdominal pain, headache, fever, chest pain or other systemic symptoms..  Review of Systems  Positive: vomiting Negative: Abdominal pain, headache, fever, chest pain  Physical Exam  BP (!) 152/96 (BP Location: Left Arm)   Pulse 77   Temp 98.3 F (36.8 C) (Oral)   Resp 18   Ht 5' 7 (1.702 m)   Wt (!) 156.9 kg   LMP 11/23/2021 (Approximate)   SpO2 100%   BMI 54.19 kg/m  Gen:   Awake, no distress   Resp:  Normal effort  MSK:   Moves extremities without difficulty  Other:   Medical Decision Making  Medically screening exam initiated at 9:51 PM.  Appropriate orders placed.  Elycia D Chaudhary was informed that the remainder of the evaluation will be completed by another provider, this initial triage assessment does not replace that evaluation, and the importance of remaining in the ED until their evaluation is complete.     Albertina Dixon, MD 11/24/23 2154

## 2023-11-24 NOTE — ED Notes (Signed)
 Patient did not answer to name being called more than a few times. Taking OTF

## 2024-05-20 ENCOUNTER — Other Ambulatory Visit: Payer: Self-pay | Admitting: Nurse Practitioner

## 2024-05-20 ENCOUNTER — Ambulatory Visit
Admission: RE | Admit: 2024-05-20 | Discharge: 2024-05-20 | Disposition: A | Source: Ambulatory Visit | Attending: Nurse Practitioner | Admitting: Nurse Practitioner

## 2024-05-20 DIAGNOSIS — M25562 Pain in left knee: Secondary | ICD-10-CM

## 2024-06-02 ENCOUNTER — Other Ambulatory Visit: Payer: Self-pay | Admitting: Obstetrics and Gynecology

## 2024-06-20 ENCOUNTER — Ambulatory Visit (HOSPITAL_COMMUNITY): Admit: 2024-06-20 | Admitting: Obstetrics and Gynecology
